# Patient Record
Sex: Male | Born: 1965 | Race: Black or African American | Hispanic: No | Marital: Single | Smoking: Never smoker
Health system: Southern US, Community
[De-identification: ages and names within clinical notes are randomized; demographics above are authoritative.]

## PROBLEM LIST (undated history)

## (undated) DIAGNOSIS — G4733 Obstructive sleep apnea (adult) (pediatric): Secondary | ICD-10-CM

## (undated) DIAGNOSIS — Z87442 Personal history of urinary calculi: Secondary | ICD-10-CM

## (undated) DIAGNOSIS — Z8679 Personal history of other diseases of the circulatory system: Secondary | ICD-10-CM

## (undated) DIAGNOSIS — M75100 Unspecified rotator cuff tear or rupture of unspecified shoulder, not specified as traumatic: Secondary | ICD-10-CM

## (undated) DIAGNOSIS — I10 Essential (primary) hypertension: Secondary | ICD-10-CM

## (undated) DIAGNOSIS — F329 Major depressive disorder, single episode, unspecified: Secondary | ICD-10-CM

## (undated) DIAGNOSIS — D869 Sarcoidosis, unspecified: Secondary | ICD-10-CM

## (undated) DIAGNOSIS — F32A Depression, unspecified: Secondary | ICD-10-CM

## (undated) DIAGNOSIS — Z5189 Encounter for other specified aftercare: Secondary | ICD-10-CM

## (undated) DIAGNOSIS — M4802 Spinal stenosis, cervical region: Secondary | ICD-10-CM

## (undated) DIAGNOSIS — K219 Gastro-esophageal reflux disease without esophagitis: Secondary | ICD-10-CM

## (undated) HISTORY — DX: Major depressive disorder, single episode, unspecified: F32.9

## (undated) HISTORY — DX: Obstructive sleep apnea (adult) (pediatric): G47.33

## (undated) HISTORY — DX: Sarcoidosis, unspecified: D86.9

## (undated) HISTORY — DX: Encounter for other specified aftercare: Z51.89

## (undated) HISTORY — PX: BRONCHOSCOPY: SUR163

## (undated) HISTORY — PX: WISDOM TOOTH EXTRACTION: SHX21

## (undated) HISTORY — PX: ORIF FEMUR FRACTURE: SHX2119

## (undated) HISTORY — DX: Depression, unspecified: F32.A

## (undated) HISTORY — PX: KNEE ARTHROSCOPY: SUR90

---

## 1999-10-07 ENCOUNTER — Ambulatory Visit (HOSPITAL_BASED_OUTPATIENT_CLINIC_OR_DEPARTMENT_OTHER): Admission: RE | Admit: 1999-10-07 | Discharge: 1999-10-07 | Payer: Self-pay | Admitting: Orthopedic Surgery

## 2002-12-12 ENCOUNTER — Emergency Department (HOSPITAL_COMMUNITY): Admission: EM | Admit: 2002-12-12 | Discharge: 2002-12-12 | Payer: Self-pay | Admitting: Emergency Medicine

## 2002-12-12 ENCOUNTER — Encounter: Payer: Self-pay | Admitting: Emergency Medicine

## 2003-01-17 ENCOUNTER — Emergency Department (HOSPITAL_COMMUNITY): Admission: EM | Admit: 2003-01-17 | Discharge: 2003-01-17 | Payer: Self-pay | Admitting: Emergency Medicine

## 2005-08-09 ENCOUNTER — Ambulatory Visit: Payer: Self-pay | Admitting: Pulmonary Disease

## 2005-08-10 ENCOUNTER — Ambulatory Visit: Admission: RE | Admit: 2005-08-10 | Discharge: 2005-08-10 | Payer: Self-pay | Admitting: Pulmonary Disease

## 2005-08-15 ENCOUNTER — Ambulatory Visit: Payer: Self-pay | Admitting: Pulmonary Disease

## 2005-08-15 ENCOUNTER — Encounter (INDEPENDENT_AMBULATORY_CARE_PROVIDER_SITE_OTHER): Payer: Self-pay | Admitting: *Deleted

## 2005-08-15 ENCOUNTER — Ambulatory Visit (HOSPITAL_COMMUNITY): Admission: RE | Admit: 2005-08-15 | Discharge: 2005-08-15 | Payer: Self-pay | Admitting: Pulmonary Disease

## 2005-08-25 ENCOUNTER — Ambulatory Visit: Payer: Self-pay | Admitting: Pulmonary Disease

## 2005-10-09 ENCOUNTER — Ambulatory Visit: Payer: Self-pay | Admitting: Pulmonary Disease

## 2005-12-20 ENCOUNTER — Ambulatory Visit: Payer: Self-pay | Admitting: Pulmonary Disease

## 2006-06-09 ENCOUNTER — Emergency Department (HOSPITAL_COMMUNITY): Admission: EM | Admit: 2006-06-09 | Discharge: 2006-06-09 | Payer: Self-pay | Admitting: Emergency Medicine

## 2009-09-15 ENCOUNTER — Encounter: Payer: Self-pay | Admitting: Pulmonary Disease

## 2009-09-15 ENCOUNTER — Ambulatory Visit: Payer: Self-pay | Admitting: Pulmonary Disease

## 2009-09-15 DIAGNOSIS — D869 Sarcoidosis, unspecified: Secondary | ICD-10-CM

## 2009-09-22 ENCOUNTER — Ambulatory Visit: Payer: Self-pay | Admitting: Internal Medicine

## 2009-09-22 LAB — CONVERTED CEMR LAB
Angiotensin 1 Converting Enzyme: 60 units/L (ref 9–67)
Anti Nuclear Antibody(ANA): NEGATIVE

## 2009-09-29 ENCOUNTER — Telehealth: Payer: Self-pay | Admitting: Pulmonary Disease

## 2009-10-08 ENCOUNTER — Ambulatory Visit: Payer: Self-pay | Admitting: Pulmonary Disease

## 2009-10-08 ENCOUNTER — Telehealth (INDEPENDENT_AMBULATORY_CARE_PROVIDER_SITE_OTHER): Payer: Self-pay | Admitting: *Deleted

## 2009-10-08 DIAGNOSIS — R002 Palpitations: Secondary | ICD-10-CM

## 2010-09-27 IMAGING — CR DG CHEST 2V
1 series · 1 of 1 positions shown · non-contrast
Comparison: 12/20/2005 (outside x-ray)

CLINICAL DATA: Dyspnea/sarcoidosis

CHEST - 2 VIEW

[view not recorded]
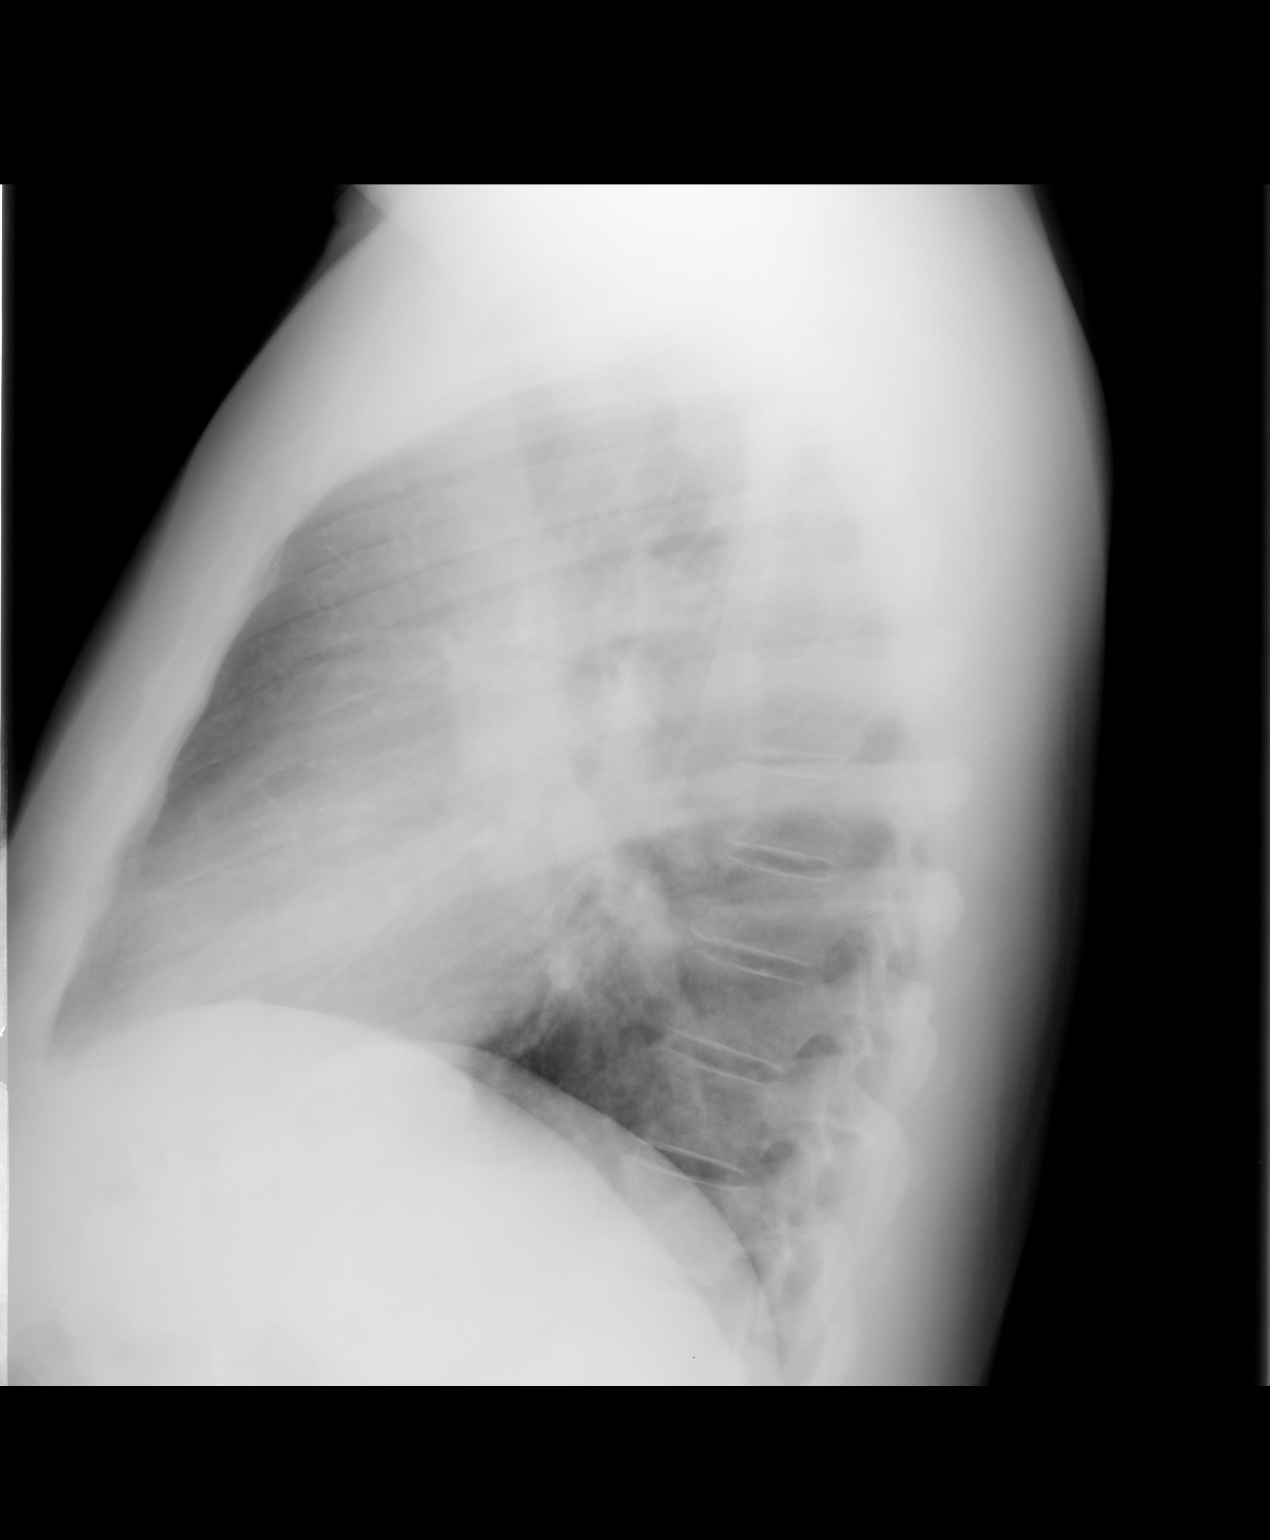

[1 of 1 positions shown; findings below may reference images not displayed]

FINDINGS: Hilar adenopathy has essentially resolved.  There is
perhaps slight fullness of the left hilum.

Heart and vascularity normal.  Lungs clear.  No pleural fluid.
IMPRESSION: 1.  Significant interval decrease in hilar adenopathy. There is
still mild fullness of the left hilum.
2.  Currently no active disease.

## 2011-01-15 LAB — CONVERTED CEMR LAB
AST: 58 units/L — ABNORMAL HIGH (ref 0–37)
Alkaline Phosphatase: 42 units/L (ref 39–117)
Basophils Absolute: 0 10*3/uL (ref 0.0–0.1)
Bilirubin, Direct: 0.1 mg/dL (ref 0.0–0.3)
CO2: 31 meq/L (ref 19–32)
Calcium: 9.6 mg/dL (ref 8.4–10.5)
Creatinine, Ser: 1 mg/dL (ref 0.4–1.5)
Eosinophils Absolute: 0.2 10*3/uL (ref 0.0–0.7)
GFR calc non Af Amer: 104.8 mL/min (ref >60)
Glucose, Bld: 95 mg/dL (ref 70–99)
Lymphocytes Relative: 34 % (ref 12.0–46.0)
MCHC: 34 g/dL (ref 30.0–36.0)
Monocytes Relative: 12.9 % — ABNORMAL HIGH (ref 3.0–12.0)
Neutrophils Relative %: 49.1 % (ref 43.0–77.0)
RBC: 5.21 M/uL (ref 4.22–5.81)
RDW: 12.7 % (ref 11.5–14.6)
Sed Rate: 10 mm/hr (ref 0–22)
Sodium: 142 meq/L (ref 135–145)

## 2011-05-05 NOTE — Op Note (Signed)
NAME:  Logan Holt, Logan Holt                   ACCOUNT NO.:  [id number]   MEDICAL RECORD NO.:  [id number]          PATIENT TYPE:  AMB   LOCATION:  ENDO                         FACILITY:  [facility]   PHYSICIAN:  Coralyn Helling, M.D.      DATE OF BIRTH:  May 23, 1966   DATE OF PROCEDURE:  08/15/2005  DATE OF DISCHARGE:                                 OPERATIVE REPORT   PREOPERATIVE DIAGNOSIS:  Hilar adenopathy, rule out sarcoidosis and rule out  lymphoma.   POSTOPERATIVE DIAGNOSIS:  Hilar adenopathy, rule out sarcoidosis and rule  out lymphoma.   PROCEDURE:  Bronchoscopy with transbronchial needle aspiration and  transbronchial biopsy and bronchial brushing and bronchial washing and BAL.   SURGEON:  Coralyn Helling, M.D.   The patient was given 50 mg of Demerol and 4 mg of Versed IV for sedation.  His airway was anesthetized with topical lidocaine.  The bronchoscopy was  introduced and vocal cords appeared normal with normal motion.  The  inspection of the right upper, right middle and right lower lobes showed  normal mucosa without endobronchial lesions.  The carina appeared slightly  widened.  On inspection of the left upper, left middle, left lingular and  left lower lobe the mucosa appeared normal and there was no obvious  endobronchial lesions.  Transbronchial needle aspiration was done of the  subcarinal lymph nodes.  Then bronchial brushing and transbronchial biopsy  was done from the left lower lobe as well as BAL from the left lower lobe.  Bleeding was well controlled and there were no immediate complications.  The  patient was transferred back to recovery in stable condition.  The specimens  will be sent for cytology, culture as well as pathology.  A post procedure x-  ray will be obtained.           ______________________________  Coralyn Helling, M.D.     VS/MEDQ  D:  08/15/2005  T:  08/15/2005  Job:  [id number]

## 2012-01-16 ENCOUNTER — Ambulatory Visit: Payer: Self-pay | Admitting: Professional

## 2012-03-14 ENCOUNTER — Other Ambulatory Visit: Payer: Self-pay | Admitting: Family Medicine

## 2012-03-14 DIAGNOSIS — R109 Unspecified abdominal pain: Secondary | ICD-10-CM

## 2012-03-15 ENCOUNTER — Other Ambulatory Visit: Payer: Self-pay

## 2012-03-20 ENCOUNTER — Ambulatory Visit
Admission: RE | Admit: 2012-03-20 | Discharge: 2012-03-20 | Disposition: A | Discharge status: Home / Self Care | Payer: BC Managed Care – PPO | Source: Ambulatory Visit | Attending: Family Medicine | Admitting: Family Medicine

## 2012-03-20 DIAGNOSIS — R109 Unspecified abdominal pain: Secondary | ICD-10-CM

## 2012-03-20 MED ORDER — IOHEXOL 300 MG/ML  SOLN
100.0000 mL | Freq: Once | INTRAMUSCULAR | Status: AC | PRN
  Administered 2012-03-20: 100 mL via INTRAVENOUS

## 2012-06-21 ENCOUNTER — Emergency Department (HOSPITAL_COMMUNITY): Payer: BC Managed Care – PPO

## 2012-06-21 ENCOUNTER — Emergency Department (HOSPITAL_COMMUNITY)
Admission: EM | Admit: 2012-06-21 | Discharge: 2012-06-21 | Disposition: A | Discharge status: Home / Self Care | Payer: BC Managed Care – PPO | Attending: Emergency Medicine | Admitting: Emergency Medicine

## 2012-06-21 ENCOUNTER — Encounter (HOSPITAL_COMMUNITY): Payer: Self-pay | Admitting: Emergency Medicine

## 2012-06-21 DIAGNOSIS — R109 Unspecified abdominal pain: Secondary | ICD-10-CM | POA: Insufficient documentation

## 2012-06-21 DIAGNOSIS — Z87442 Personal history of urinary calculi: Secondary | ICD-10-CM | POA: Insufficient documentation

## 2012-06-21 DIAGNOSIS — I10 Essential (primary) hypertension: Secondary | ICD-10-CM | POA: Insufficient documentation

## 2012-06-21 HISTORY — DX: Essential (primary) hypertension: I10

## 2012-06-21 LAB — URINALYSIS, ROUTINE W REFLEX MICROSCOPIC
Bilirubin Urine: NEGATIVE
Specific Gravity, Urine: 1.016 (ref 1.005–1.030)
Urobilinogen, UA: 0.2 mg/dL (ref 0.0–1.0)
pH: 7.5 (ref 5.0–8.0)

## 2012-06-21 LAB — POCT I-STAT, CHEM 8
BUN: 13 mg/dL (ref 6–23)
Chloride: 101 mEq/L (ref 96–112)
Creatinine, Ser: 1.2 mg/dL (ref 0.50–1.35)
Glucose, Bld: 130 mg/dL — ABNORMAL HIGH (ref 70–99)
Potassium: 3 mEq/L — ABNORMAL LOW (ref 3.5–5.1)

## 2012-06-21 LAB — URINE MICROSCOPIC-ADD ON

## 2012-06-21 MED ORDER — OXYCODONE-ACETAMINOPHEN 5-325 MG PO TABS
2.0000 | ORAL_TABLET | Freq: Once | ORAL | Status: AC
  Administered 2012-06-21: 2 via ORAL
  Filled 2012-06-21: qty 2

## 2012-06-21 MED ORDER — POTASSIUM CHLORIDE CRYS ER 20 MEQ PO TBCR
40.0000 meq | EXTENDED_RELEASE_TABLET | Freq: Once | ORAL | Status: AC
  Administered 2012-06-21: 40 meq via ORAL
  Filled 2012-06-21: qty 2

## 2012-06-21 MED ORDER — TAMSULOSIN HCL 0.4 MG PO CAPS: 0.4000 mg | ORAL_CAPSULE | Freq: Every day | ORAL | Status: DC

## 2012-06-21 MED ORDER — OXYCODONE-ACETAMINOPHEN 5-325 MG PO TABS: 1.0000 | ORAL_TABLET | ORAL | Status: AC | PRN

## 2012-06-21 MED ORDER — ONDANSETRON 4 MG PO TBDP
8.0000 mg | ORAL_TABLET | Freq: Once | ORAL | Status: AC
  Administered 2012-06-21: 8 mg via ORAL
  Filled 2012-06-21: qty 2

## 2012-06-21 NOTE — ED Provider Notes (Signed)
Medical screening examination/treatment/procedure(s) were performed by non-physician practitioner and as supervising physician I was immediately available for consultation/collaboration.  Toy Baker, MD 06/21/12 1031

## 2012-06-21 NOTE — ED Notes (Signed)
Left flank pain started around 0245, was woken up with sharp pain on my left side.

## 2012-06-21 NOTE — ED Notes (Signed)
Patient transported to X-ray 

## 2012-06-21 NOTE — ED Provider Notes (Signed)
Pt's UA shows hgb only - no evidence of infx or crystals. Pt will be txed as presumed nephrolithiasis. He has been resting comfortably during his stay in the ED. Rxes for flomax, percocet - instructed to f/u with urology.  Grant Fontana, [place] 06/21/12 [phone]

## 2012-06-21 NOTE — ED Notes (Addendum)
Pt has left sided pain; hx of kidney stones and reports this feels similar; tender to palpation. Nauseated; pain in testicle area on left side as well.

## 2012-06-21 NOTE — ED Notes (Signed)
Pt returned from xray

## 2012-06-21 NOTE — ED Provider Notes (Signed)
History     CSN: [id number]  Arrival date & time 06/21/12  0348   First MD Initiated Contact with Patient 06/21/12 [phone]      Chief Complaint  Patient presents with  . Flank Pain    HPI  History provided by the patient. Patient is a 46 year old male with history of hypertension and prior kidney stones presents with complaints of left flank and abdominal pains. Patient states pain began acutely and woke up from sleep around 2 or 3 AM. Patient states he waited at home to see if symptoms would relieve on their own. Patient tried to make it comfortable in many positions without any change in symptoms. Pain has been gradually increasing over this time. Patient denies any other aggravating or alleviating factors. Pain is described as sharp radiating to left lower abdomen and groin occasionally testicle. Pain is somewhat similar to a kidney stone 2 years ago. Symptoms are also associated with slight nausea. Patient denies any associated fever, chills, sweats, vomiting, diarrhea or constipation.     Past Medical History  Diagnosis Date  . Hypertension   . Kidney calculi     History reviewed. No pertinent past surgical history.  No family history on file.  History  Substance Use Topics  . Smoking status: Not on file  . Smokeless tobacco: Not on file  . Alcohol Use:       Review of Systems  Constitutional: Negative for fever, chills and appetite change.  Gastrointestinal: Positive for nausea and abdominal pain. Negative for vomiting, diarrhea, constipation and blood in stool.  Genitourinary: Positive for flank pain. Negative for dysuria, frequency, hematuria, discharge, scrotal swelling, penile pain and testicular pain.  Skin: Negative for rash.    Allergies  Review of patient's allergies indicates no known allergies.  Home Medications  No current outpatient prescriptions on file.  BP 158/106  Pulse 86  Temp 97.8 F (36.6 C)  Resp 20  SpO2 100%  Physical Exam  Nursing  note and vitals reviewed. Constitutional: He is oriented to person, place, and time. He appears well-developed and well-nourished. No distress.  HENT:  Head: Normocephalic.  Cardiovascular: Normal rate and regular rhythm.   Pulmonary/Chest: Effort normal and breath sounds normal. No respiratory distress. He has no wheezes. He has no rales.  Abdominal: Soft. There is tenderness in the left upper quadrant and left lower quadrant. There is CVA tenderness. There is no rebound, no guarding, no tenderness at McBurney's point and negative Murphy's sign.       Mild left CVA tenderness  Musculoskeletal: Normal range of motion.  Neurological: He is alert and oriented to person, place, and time.  Skin: Skin is warm.  Psychiatric: He has a normal mood and affect. His behavior is normal.    ED Course  Procedures   Results for orders placed during the hospital encounter of 06/21/12  POCT I-STAT, CHEM 8      Component Value Range   Sodium 141  135 - 145 mEq/L   Potassium 3.0 (*) 3.5 - 5.1 mEq/L   Chloride 101  96 - 112 mEq/L   BUN 13  6 - 23 mg/dL   Creatinine, Ser 4.13  0.50 - 1.35 mg/dL   Glucose, Bld 244 (*) 70 - 99 mg/dL   Calcium, Ion 0.10  2.72 - 1.23 mmol/L   TCO2 26  0 - 100 mmol/L   Hemoglobin 15.0  13.0 - 17.0 g/dL   HCT 53.6  64.4 - 03.4 %  Dg Abd 1 View  06/21/2012  *RADIOLOGY REPORT*  Clinical Data: Left flank pain.  ABDOMEN - 1 VIEW  Comparison: 03/20/2012 CT  Findings: There are numerous calcifications projecting over the left pelvis.  These are primarily in keeping with phlebolith however it is not possible to exclude ureteral stone in this setting.  The tiny renal stones noted on recent CT are poorly demonstrated radiographically.  Moderate stool burden. Nonobstructive bowel gas pattern.  Heterotopic bone along the left femur.  No acute osseous finding.  IMPRESSION: Multiple pelvic phlebolith on the left.  It is not possible to exclude a small ureteral stone in this setting.   If concern persists, consider ultrasound to evaluate for hydronephrosis.  Original Report Authenticated By: Waneta Martins, M.D.     1. Left flank pain       MDM  4:05 AM patient seen and evaluated. Patient in no acute distress and appears comfortable.  Patient with a CT scan 3 months ago showing 2 mm bilateral kidney stones. No ureteral stones that time..There is significant interval change in size of the kidney stones. Will obtain KUB to evaluate for ureteral stone at this time.   Pt feeling better after Percocet.  Pt is sleeping.  Pt has not given urine yet.  Labs with slight hypokalemia otherwise unremarkable.  PO potassium given.  KUB with no definite ureteral stone.  Pt discussed in sign out with Grant Fontana PA-C.  She will follow UA and dispo pt.     Angus Seller, [place] 06/21/12 [phone]

## 2012-06-22 NOTE — ED Provider Notes (Signed)
Medical screening examination/treatment/procedure(s) were performed by non-physician practitioner and as supervising physician I was immediately available for consultation/collaboration.  Twylah Bennetts, MD 06/22/12 0435 

## 2013-07-03 IMAGING — CR DG ABDOMEN 1V
1 series · 1 of 1 positions shown · non-contrast
Comparison: 03/20/2012 CT

CLINICAL DATA: Left flank pain.

ABDOMEN - 1 VIEW

[t abdomen supine]
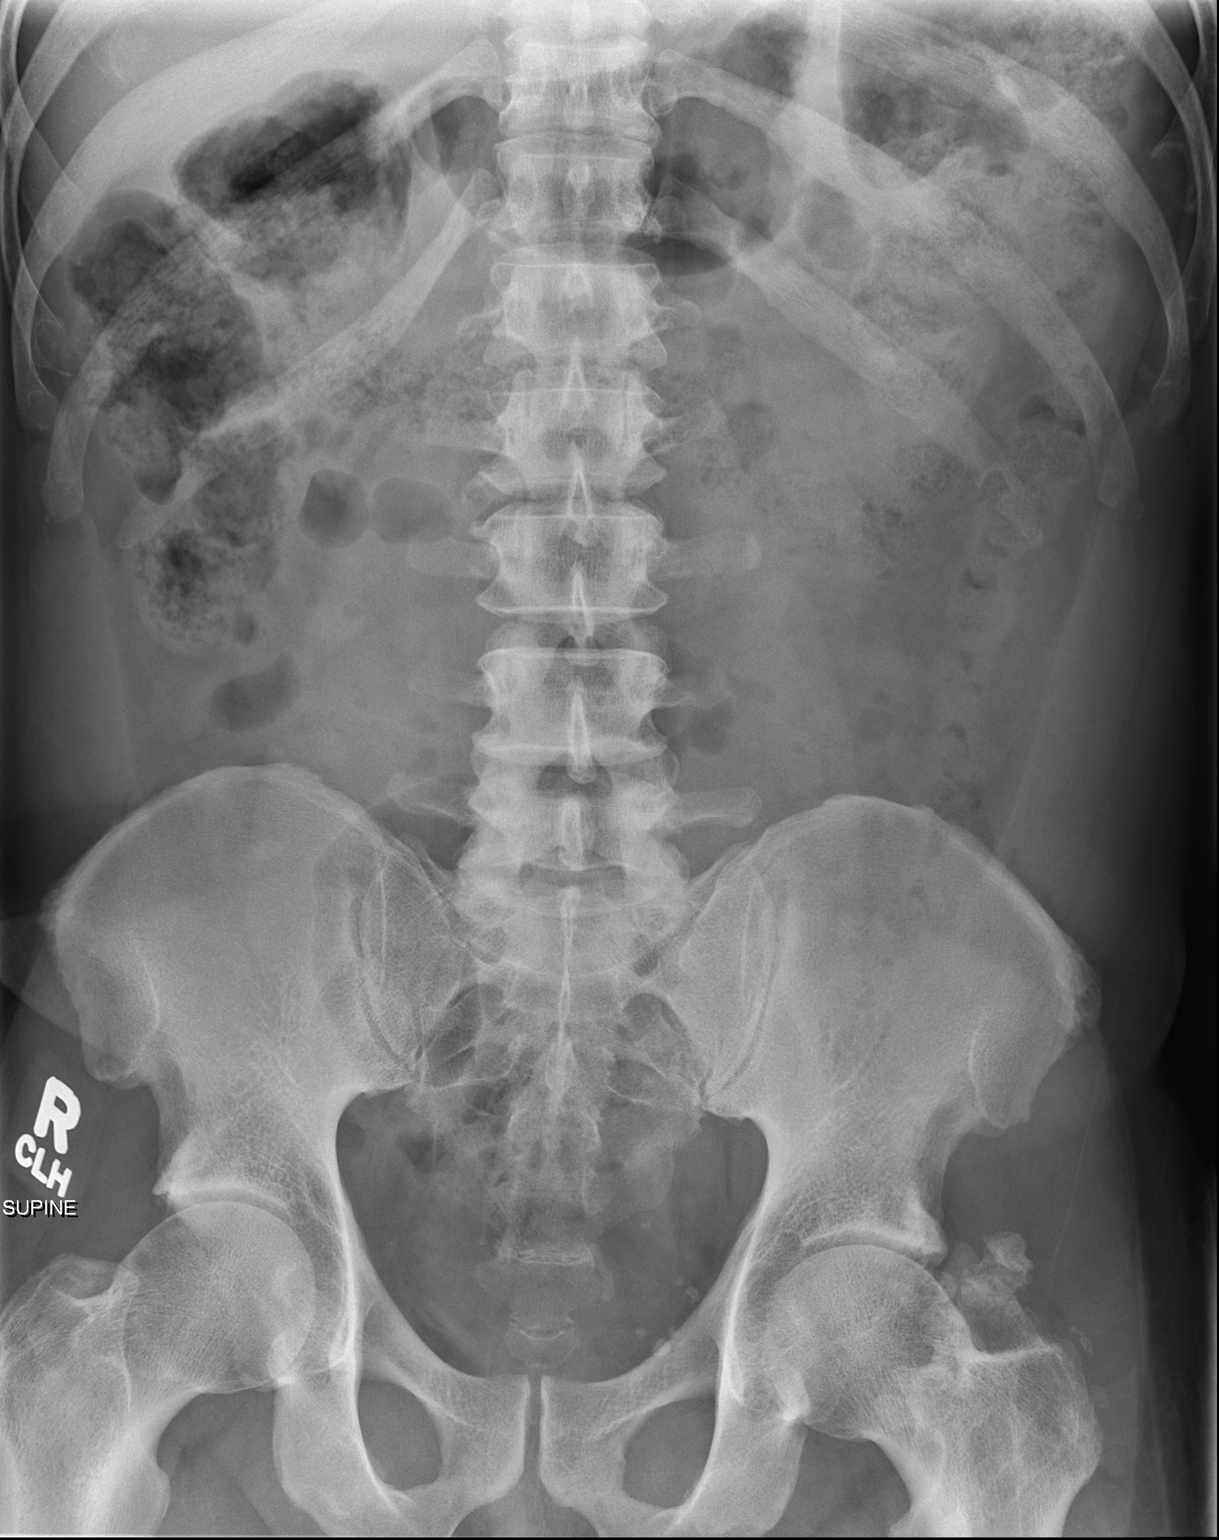

[1 of 1 positions shown; findings below may reference images not displayed]

FINDINGS: There are numerous calcifications projecting over the
left pelvis.  These are primarily in keeping with phlebolith
however it is not possible to exclude ureteral stone in this
setting.  The tiny renal stones noted on recent CT are poorly
demonstrated radiographically.  Moderate stool burden.
Nonobstructive bowel gas pattern.  Heterotopic bone along the left
femur.  No acute osseous finding.
IMPRESSION: Multiple pelvic phlebolith on the left.  It is not possible to
exclude a small ureteral stone in this setting.

If concern persists, consider ultrasound to evaluate for
hydronephrosis.

## 2014-01-22 ENCOUNTER — Encounter: Payer: Self-pay | Admitting: General Surgery

## 2014-01-22 DIAGNOSIS — I493 Ventricular premature depolarization: Secondary | ICD-10-CM

## 2014-01-22 DIAGNOSIS — G4733 Obstructive sleep apnea (adult) (pediatric): Secondary | ICD-10-CM | POA: Insufficient documentation

## 2014-02-17 ENCOUNTER — Ambulatory Visit: Payer: BC Managed Care – PPO | Admitting: Family Medicine

## 2014-03-18 DIAGNOSIS — M75100 Unspecified rotator cuff tear or rupture of unspecified shoulder, not specified as traumatic: Secondary | ICD-10-CM

## 2014-03-18 HISTORY — DX: Unspecified rotator cuff tear or rupture of unspecified shoulder, not specified as traumatic: M75.100

## 2014-04-14 ENCOUNTER — Other Ambulatory Visit: Payer: Self-pay | Admitting: Orthopedic Surgery

## 2014-04-15 ENCOUNTER — Encounter (HOSPITAL_BASED_OUTPATIENT_CLINIC_OR_DEPARTMENT_OTHER): Payer: Self-pay | Admitting: *Deleted

## 2014-04-20 NOTE — H&P (Signed)
Logan Holt is an 48 y.o. male.   Chief Complaint: c/o chronic and progressive right shoulder pain and weakness HPI: .  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  He reports a one year history of progressive right shoulder pain.  When he moves his shoulder he hears crunching.  He cannot put a sleeve on without pain when he reaches behind his back.  He cannot lie on the right side.  He can no longer throw a ball without significant pain.  He has lost power.  He cannot shoot basketball.  He has tried Myoflex without relief.  He is now referred for an upper extremity orthopaedic opinion.     Past Medical History  Diagnosis Date  . Sarcoidosis   . History of hypertension     was taken off med. after losing weight  . OSA (obstructive sleep apnea)     was diagnosed when weight was 205; does not use CPAP  . GERD (gastroesophageal reflux disease)     prn Nexium  . Rotator cuff tear 03/2014    right  . History of kidney stones     Past Surgical History  Procedure Laterality Date  . Bronchoscopy    . Knee arthroscopy Left   . Orif femur fracture Left     History reviewed. No pertinent family history. Social History:  reports that he has never smoked. He has never used smokeless tobacco. He reports that he does not drink alcohol or use illicit drugs.  Allergies: No Known Allergies  No prescriptions prior to admission    No results found for this or any previous visit (from the past 48 hour(s)).  No results found.   Pertinent items are noted in HPI.  Height 5\' 5"  (1.651 m), weight 81.647 kg (180 lb).  General appearance: alert Head: Normocephalic, without obvious abnormality Neck: supple, symmetrical, trachea midline Resp: clear to auscultation bilaterally Cardio: regular rate and rhythm GI: normal findings: bowel sounds normal Extremities: .  He has range of motion combined elevation right shoulder 160, left shoulder 175, external rotation right  shoulder 80, left shoulder 90 at 90 degrees abduction, internal rotation right shoulder iliac crest, left shoulder T-12.  He has a very painful push-off test on the right, tight, but not painful on the left.  He has pain with cross-torso adduction, belly pat or opposite shoulder pat.  His push-off test is extremely painful.   His examination is compatible with a subscapularis rotator cuff tear and possible supraspinatus rotator cuff tear.  He clearly has internal derangement of his shoulder mechanism. Plain films of the shoulder are reviewed.  He has an osteophyte at the lateral acromion, AC degenerative change and sclerosis of the greater tuberosity with new bone formation consistent with rotator cuff tear.  On his internal rotation film there is sclerosis of the greater tuberosity consistent with cuff disease.    He had MRI obtained at [facility] on 04/01/14. He returns to interpret the MRI.  He has a very significant tear of his anterior supraspinatus and extensive tendinopathy of his supraspinatus and infraspinatus tendons.  He has swelling and tendinopathy of the subscapularis without evidence of a tear.  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 . Plan: To the OR  for right SA with SAD/DCR and RC repair as needed.The procedure, risks,benefits and post-op course were discussed with the patient at length and they were in agreement with the plan.  Marveen ReeksRobert J Dasnoit 04/20/2014, 4:41 PM  H&P documentation: 04/21/2014  -History and Physical Reviewed  -Patient has been re-examined  -No change in the plan of care  Wyn Forsterobert V Gracia Saggese Jr, MD

## 2014-04-21 ENCOUNTER — Encounter (HOSPITAL_BASED_OUTPATIENT_CLINIC_OR_DEPARTMENT_OTHER)
Admission: RE | Disposition: A | Discharge status: Home / Self Care | Payer: Self-pay | Source: Ambulatory Visit | Attending: Orthopedic Surgery

## 2014-04-21 ENCOUNTER — Encounter (HOSPITAL_BASED_OUTPATIENT_CLINIC_OR_DEPARTMENT_OTHER): Payer: Self-pay | Admitting: Orthopedic Surgery

## 2014-04-21 ENCOUNTER — Ambulatory Visit (HOSPITAL_BASED_OUTPATIENT_CLINIC_OR_DEPARTMENT_OTHER)
Admission: RE | Admit: 2014-04-21 | Discharge: 2014-04-22 | Disposition: A | Discharge status: Home / Self Care | Payer: 59 | Source: Ambulatory Visit | Attending: Orthopedic Surgery | Admitting: Orthopedic Surgery

## 2014-04-21 ENCOUNTER — Ambulatory Visit (HOSPITAL_BASED_OUTPATIENT_CLINIC_OR_DEPARTMENT_OTHER): Payer: 59 | Admitting: Anesthesiology

## 2014-04-21 ENCOUNTER — Encounter (HOSPITAL_BASED_OUTPATIENT_CLINIC_OR_DEPARTMENT_OTHER): Payer: 59 | Admitting: Anesthesiology

## 2014-04-21 DIAGNOSIS — G4733 Obstructive sleep apnea (adult) (pediatric): Secondary | ICD-10-CM | POA: Insufficient documentation

## 2014-04-21 DIAGNOSIS — M7580 Other shoulder lesions, unspecified shoulder: Secondary | ICD-10-CM

## 2014-04-21 DIAGNOSIS — K219 Gastro-esophageal reflux disease without esophagitis: Secondary | ICD-10-CM | POA: Insufficient documentation

## 2014-04-21 DIAGNOSIS — I10 Essential (primary) hypertension: Secondary | ICD-10-CM | POA: Insufficient documentation

## 2014-04-21 DIAGNOSIS — D869 Sarcoidosis, unspecified: Secondary | ICD-10-CM | POA: Insufficient documentation

## 2014-04-21 DIAGNOSIS — X58XXXA Exposure to other specified factors, initial encounter: Secondary | ICD-10-CM | POA: Insufficient documentation

## 2014-04-21 DIAGNOSIS — M75100 Unspecified rotator cuff tear or rupture of unspecified shoulder, not specified as traumatic: Secondary | ICD-10-CM | POA: Diagnosis present

## 2014-04-21 DIAGNOSIS — M25819 Other specified joint disorders, unspecified shoulder: Secondary | ICD-10-CM | POA: Insufficient documentation

## 2014-04-21 DIAGNOSIS — S43429A Sprain of unspecified rotator cuff capsule, initial encounter: Secondary | ICD-10-CM | POA: Insufficient documentation

## 2014-04-21 DIAGNOSIS — Y929 Unspecified place or not applicable: Secondary | ICD-10-CM | POA: Insufficient documentation

## 2014-04-21 DIAGNOSIS — S43439A Superior glenoid labrum lesion of unspecified shoulder, initial encounter: Secondary | ICD-10-CM | POA: Insufficient documentation

## 2014-04-21 HISTORY — DX: Gastro-esophageal reflux disease without esophagitis: K21.9

## 2014-04-21 HISTORY — PX: SHOULDER ARTHROSCOPY WITH ROTATOR CUFF REPAIR AND SUBACROMIAL DECOMPRESSION: SHX5686

## 2014-04-21 HISTORY — DX: Personal history of other diseases of the circulatory system: Z86.79

## 2014-04-21 HISTORY — DX: Unspecified rotator cuff tear or rupture of unspecified shoulder, not specified as traumatic: M75.100

## 2014-04-21 HISTORY — DX: Personal history of urinary calculi: Z87.442

## 2014-04-21 LAB — POCT HEMOGLOBIN-HEMACUE: HEMOGLOBIN: 15.3 g/dL (ref 13.0–17.0)

## 2014-04-21 SURGERY — SHOULDER ARTHROSCOPY WITH ROTATOR CUFF REPAIR AND SUBACROMIAL DECOMPRESSION
Anesthesia: Regional | Site: Shoulder | Laterality: Right

## 2014-04-21 MED ORDER — MIDAZOLAM HCL 2 MG/2ML IJ SOLN
INTRAMUSCULAR | Status: AC
  Filled 2014-04-21: qty 2

## 2014-04-21 MED ORDER — HYDROMORPHONE HCL PF 1 MG/ML IJ SOLN: 0.2500 mg | INTRAMUSCULAR | Status: DC | PRN

## 2014-04-21 MED ORDER — OXYCODONE HCL 5 MG PO TABS: 5.0000 mg | ORAL_TABLET | Freq: Once | ORAL | Status: DC | PRN

## 2014-04-21 MED ORDER — LIDOCAINE HCL (CARDIAC) 20 MG/ML IV SOLN
INTRAVENOUS | Status: DC | PRN
  Administered 2014-04-21: 50 mg via INTRAVENOUS

## 2014-04-21 MED ORDER — FENTANYL CITRATE 0.05 MG/ML IJ SOLN
INTRAMUSCULAR | Status: DC | PRN
  Administered 2014-04-21 (×2): 100 ug via INTRAVENOUS

## 2014-04-21 MED ORDER — CEPHALEXIN 500 MG PO CAPS: 500.0000 mg | ORAL_CAPSULE | Freq: Three times a day (TID) | ORAL | Status: DC

## 2014-04-21 MED ORDER — ONDANSETRON HCL 4 MG PO TABS: 4.0000 mg | ORAL_TABLET | Freq: Four times a day (QID) | ORAL | Status: DC | PRN

## 2014-04-21 MED ORDER — PHENYLEPHRINE HCL 10 MG/ML IJ SOLN
INTRAMUSCULAR | Status: DC | PRN
  Administered 2014-04-21 (×6): 50 ug via INTRAVENOUS

## 2014-04-21 MED ORDER — DOCUSATE SODIUM 100 MG PO CAPS: 100.0000 mg | ORAL_CAPSULE | Freq: Two times a day (BID) | ORAL | Status: DC

## 2014-04-21 MED ORDER — GLYCOPYRROLATE 0.2 MG/ML IJ SOLN
INTRAMUSCULAR | Status: DC | PRN
  Administered 2014-04-21: .5 mg via INTRAVENOUS

## 2014-04-21 MED ORDER — SUCCINYLCHOLINE CHLORIDE 20 MG/ML IJ SOLN
INTRAMUSCULAR | Status: DC | PRN
  Administered 2014-04-21: 60 mg via INTRAVENOUS

## 2014-04-21 MED ORDER — OXYCODONE-ACETAMINOPHEN 5-325 MG PO TABS: 1.0000 | ORAL_TABLET | ORAL | Status: DC | PRN

## 2014-04-21 MED ORDER — OXYCODONE HCL 5 MG/5ML PO SOLN: 5.0000 mg | Freq: Once | ORAL | Status: DC | PRN

## 2014-04-21 MED ORDER — DEXAMETHASONE SODIUM PHOSPHATE 4 MG/ML IJ SOLN
INTRAMUSCULAR | Status: DC | PRN
  Administered 2014-04-21: 10 mg via INTRAVENOUS

## 2014-04-21 MED ORDER — SODIUM CHLORIDE 0.9 % IR SOLN
Status: DC | PRN
  Administered 2014-04-21: 12000 mL

## 2014-04-21 MED ORDER — CEFAZOLIN SODIUM-DEXTROSE 2-3 GM-% IV SOLR
INTRAVENOUS | Status: AC
  Filled 2014-04-21: qty 50

## 2014-04-21 MED ORDER — METOCLOPRAMIDE HCL 5 MG PO TABS: 5.0000 mg | ORAL_TABLET | Freq: Three times a day (TID) | ORAL | Status: DC | PRN

## 2014-04-21 MED ORDER — CEFAZOLIN SODIUM-DEXTROSE 2-3 GM-% IV SOLR
2.0000 g | INTRAVENOUS | Status: AC
  Administered 2014-04-21: 2 g via INTRAVENOUS

## 2014-04-21 MED ORDER — BUPIVACAINE HCL (PF) 0.5 % IJ SOLN
INTRAMUSCULAR | Status: DC | PRN
  Administered 2014-04-21: 40 mL
  Administered 2014-04-21: 20 mL via PERINEURAL

## 2014-04-21 MED ORDER — FENTANYL CITRATE 0.05 MG/ML IJ SOLN
INTRAMUSCULAR | Status: AC
  Filled 2014-04-21: qty 4

## 2014-04-21 MED ORDER — ACETAMINOPHEN 160 MG/5ML PO SOLN: 325.0000 mg | ORAL | Status: DC | PRN

## 2014-04-21 MED ORDER — METHOCARBAMOL 1000 MG/10ML IJ SOLN: 500.0000 mg | Freq: Four times a day (QID) | INTRAVENOUS | Status: DC | PRN

## 2014-04-21 MED ORDER — HYDROMORPHONE HCL 2 MG PO TABS: ORAL_TABLET | ORAL | Status: DC

## 2014-04-21 MED ORDER — LACTATED RINGERS IV SOLN
INTRAVENOUS | Status: DC
  Administered 2014-04-21 (×2): via INTRAVENOUS

## 2014-04-21 MED ORDER — CEFAZOLIN SODIUM-DEXTROSE 2-3 GM-% IV SOLR
2.0000 g | Freq: Four times a day (QID) | INTRAVENOUS | Status: AC
  Administered 2014-04-21 – 2014-04-22 (×2): 2 g via INTRAVENOUS

## 2014-04-21 MED ORDER — CEFAZOLIN SODIUM-DEXTROSE 2-3 GM-% IV SOLR: 2.0000 g | Freq: Four times a day (QID) | INTRAVENOUS | Status: DC

## 2014-04-21 MED ORDER — FENTANYL CITRATE 0.05 MG/ML IJ SOLN
INTRAMUSCULAR | Status: AC
  Filled 2014-04-21: qty 2

## 2014-04-21 MED ORDER — DEXTROSE-NACL 5-0.45 % IV SOLN
INTRAVENOUS | Status: DC
  Administered 2014-04-21 – 2014-04-22 (×2): via INTRAVENOUS

## 2014-04-21 MED ORDER — ROCURONIUM BROMIDE 100 MG/10ML IV SOLN
INTRAVENOUS | Status: DC | PRN
  Administered 2014-04-21: 20 mg via INTRAVENOUS

## 2014-04-21 MED ORDER — HYDROCODONE-ACETAMINOPHEN 5-325 MG PO TABS
1.0000 | ORAL_TABLET | ORAL | Status: DC | PRN
  Administered 2014-04-22: 2 via ORAL
  Filled 2014-04-21: qty 2

## 2014-04-21 MED ORDER — METHOCARBAMOL 500 MG PO TABS
500.0000 mg | ORAL_TABLET | Freq: Four times a day (QID) | ORAL | Status: DC | PRN
  Administered 2014-04-22: 500 mg via ORAL
  Filled 2014-04-21: qty 1

## 2014-04-21 MED ORDER — MIDAZOLAM HCL 5 MG/5ML IJ SOLN
INTRAMUSCULAR | Status: DC | PRN
  Administered 2014-04-21: 2 mg via INTRAVENOUS

## 2014-04-21 MED ORDER — ONDANSETRON HCL 4 MG/2ML IJ SOLN
4.0000 mg | Freq: Four times a day (QID) | INTRAMUSCULAR | Status: DC | PRN
  Administered 2014-04-21: 4 mg via INTRAVENOUS
  Filled 2014-04-21: qty 2

## 2014-04-21 MED ORDER — CHLORHEXIDINE GLUCONATE 4 % EX LIQD: 60.0000 mL | Freq: Once | CUTANEOUS | Status: DC

## 2014-04-21 MED ORDER — FENTANYL CITRATE 0.05 MG/ML IJ SOLN
50.0000 ug | INTRAMUSCULAR | Status: DC | PRN
  Administered 2014-04-21: 100 ug via INTRAVENOUS

## 2014-04-21 MED ORDER — NEOSTIGMINE METHYLSULFATE 10 MG/10ML IV SOLN
INTRAVENOUS | Status: DC | PRN
  Administered 2014-04-21: 4 mg via INTRAVENOUS

## 2014-04-21 MED ORDER — MIDAZOLAM HCL 2 MG/ML PO SYRP: 12.0000 mg | ORAL_SOLUTION | Freq: Once | ORAL | Status: DC | PRN

## 2014-04-21 MED ORDER — METOPROLOL TARTRATE 1 MG/ML IV SOLN
INTRAVENOUS | Status: DC | PRN
  Administered 2014-04-21: 3 mg via INTRAVENOUS

## 2014-04-21 MED ORDER — METOCLOPRAMIDE HCL 5 MG/ML IJ SOLN: 5.0000 mg | Freq: Three times a day (TID) | INTRAMUSCULAR | Status: DC | PRN

## 2014-04-21 MED ORDER — MIDAZOLAM HCL 2 MG/2ML IJ SOLN
1.0000 mg | INTRAMUSCULAR | Status: DC | PRN
  Administered 2014-04-21: 2 mg via INTRAVENOUS

## 2014-04-21 MED ORDER — ONDANSETRON HCL 4 MG/2ML IJ SOLN
INTRAMUSCULAR | Status: DC | PRN
  Administered 2014-04-21: 4 mg via INTRAVENOUS

## 2014-04-21 MED ORDER — ONDANSETRON HCL 4 MG/2ML IJ SOLN: 4.0000 mg | Freq: Once | INTRAMUSCULAR | Status: DC | PRN

## 2014-04-21 MED ORDER — HYDROMORPHONE HCL PF 1 MG/ML IJ SOLN: 0.5000 mg | INTRAMUSCULAR | Status: DC | PRN

## 2014-04-21 MED ORDER — ACETAMINOPHEN 325 MG PO TABS: 325.0000 mg | ORAL_TABLET | ORAL | Status: DC | PRN

## 2014-04-21 MED ORDER — PROPOFOL 10 MG/ML IV BOLUS
INTRAVENOUS | Status: DC | PRN
  Administered 2014-04-21: 200 mg via INTRAVENOUS

## 2014-04-21 SURGICAL SUPPLY — 82 items
ANCH SUT SWLK 19.1 CLS EYLT VT (Anchor) ×1 IMPLANT
ANCH SUT SWLK 19.1X4.75 VT (Anchor) ×2 IMPLANT
ANCHOR BIO SWLOCK 4.75 W/TIG (Anchor) ×1 IMPLANT
ANCHOR PEEK 4.75X19.1 SWLK C (Anchor) ×2 IMPLANT
BANDAGE ADH SHEER 1  50/CT (GAUZE/BANDAGES/DRESSINGS) IMPLANT
BLADE 15 SAFETY STRL DISP (BLADE) ×2 IMPLANT
BLADE AVERAGE 25X9 (BLADE) IMPLANT
BLADE CUTTER MENIS 5.5 (BLADE) IMPLANT
BLADE SURG 15 STRL LF DISP TIS (BLADE) IMPLANT
BLADE SURG 15 STRL SS (BLADE) ×4
BUR EGG 3PK/BX (BURR) IMPLANT
BUR OVAL 6.0 (BURR) ×2 IMPLANT
CANISTER SUCT 3000ML (MISCELLANEOUS) IMPLANT
CANNULA TWIST IN 8.25X7CM (CANNULA) ×1 IMPLANT
CLEANER CAUTERY TIP 5X5 PAD (MISCELLANEOUS) IMPLANT
CUTTER MENISCUS  4.2MM (BLADE) ×1
CUTTER MENISCUS 4.2MM (BLADE) ×1 IMPLANT
DECANTER SPIKE VIAL GLASS SM (MISCELLANEOUS) IMPLANT
DRAPE INCISE IOBAN 66X45 STRL (DRAPES) ×2 IMPLANT
DRAPE STERI 35X30 U-POUCH (DRAPES) ×2 IMPLANT
DRAPE SURG 17X23 STRL (DRAPES) ×2 IMPLANT
DRAPE U-SHAPE 47X51 STRL (DRAPES) ×2 IMPLANT
DRAPE U-SHAPE 76X120 STRL (DRAPES) ×4 IMPLANT
DRSG PAD ABDOMINAL 8X10 ST (GAUZE/BANDAGES/DRESSINGS) ×2 IMPLANT
DURAPREP 26ML APPLICATOR (WOUND CARE) ×2 IMPLANT
ELECT REM PT RETURN 9FT ADLT (ELECTROSURGICAL)
ELECTRODE REM PT RTRN 9FT ADLT (ELECTROSURGICAL) IMPLANT
GAUZE SPONGE 4X4 12PLY STRL (GAUZE/BANDAGES/DRESSINGS) ×2 IMPLANT
GLOVE BIO SURGEON STRL SZ 6.5 (GLOVE) ×1 IMPLANT
GLOVE BIOGEL M STRL SZ7.5 (GLOVE) ×2 IMPLANT
GLOVE BIOGEL PI IND STRL 7.0 (GLOVE) IMPLANT
GLOVE BIOGEL PI IND STRL 8 (GLOVE) ×2 IMPLANT
GLOVE BIOGEL PI INDICATOR 7.0 (GLOVE) ×1
GLOVE BIOGEL PI INDICATOR 8 (GLOVE) ×2
GLOVE EXAM NITRILE MD LF STRL (GLOVE) ×1 IMPLANT
GLOVE ORTHO TXT STRL SZ7.5 (GLOVE) ×2 IMPLANT
GOWN STRL REUS W/TWL MED LVL3 (GOWN DISPOSABLE) ×1 IMPLANT
GOWN STRL REUS W/TWL XL LVL4 (GOWN DISPOSABLE) ×4 IMPLANT
LASSO 90 CVE QUICKPAS (DISPOSABLE) ×1 IMPLANT
MANIFOLD NEPTUNE II (INSTRUMENTS) ×2 IMPLANT
NDL SCORPION (NEEDLE) ×1 IMPLANT
NDL SUT 6 .5 CRC .975X.05 MAYO (NEEDLE) IMPLANT
NEEDLE MAYO TAPER (NEEDLE) ×2
NEEDLE MINI RC 24MM (NEEDLE) IMPLANT
NEEDLE SCORPION (NEEDLE) ×2 IMPLANT
PACK ARTHROSCOPY DSU (CUSTOM PROCEDURE TRAY) ×2 IMPLANT
PACK BASIN DAY SURGERY FS (CUSTOM PROCEDURE TRAY) ×2 IMPLANT
PAD CLEANER CAUTERY TIP 5X5 (MISCELLANEOUS)
PASSER SUT SWANSON 36MM LOOP (INSTRUMENTS) IMPLANT
PENCIL BUTTON HOLSTER BLD 10FT (ELECTRODE) ×1 IMPLANT
SLEEVE SCD COMPRESS KNEE MED (MISCELLANEOUS) ×2 IMPLANT
SLING ARM LRG ADULT FOAM STRAP (SOFTGOODS) ×1 IMPLANT
SLING ARM MED ADULT FOAM STRAP (SOFTGOODS) IMPLANT
SPONGE LAP 4X18 X RAY DECT (DISPOSABLE) ×1 IMPLANT
STRIP CLOSURE SKIN 1/2X4 (GAUZE/BANDAGES/DRESSINGS) ×1 IMPLANT
SUCTION FRAZIER TIP 10 FR DISP (SUCTIONS) IMPLANT
SUT FIBERWIRE #2 38 T-5 BLUE (SUTURE)
SUT FIBERWIRE 3-0 18 TAPR NDL (SUTURE)
SUT PROLENE 1 CT (SUTURE) IMPLANT
SUT PROLENE 3 0 PS 2 (SUTURE) ×2 IMPLANT
SUT TIGER TAPE 7 IN WHITE (SUTURE) IMPLANT
SUT VIC AB 0 CT1 27 (SUTURE)
SUT VIC AB 0 CT1 27XBRD ANBCTR (SUTURE) IMPLANT
SUT VIC AB 0 SH 27 (SUTURE) ×1 IMPLANT
SUT VIC AB 2-0 SH 27 (SUTURE) ×2
SUT VIC AB 2-0 SH 27XBRD (SUTURE) IMPLANT
SUT VIC AB 3-0 SH 27 (SUTURE)
SUT VIC AB 3-0 SH 27X BRD (SUTURE) IMPLANT
SUT VIC AB 3-0 X1 27 (SUTURE) IMPLANT
SUTURE FIBERWR #2 38 T-5 BLUE (SUTURE) IMPLANT
SUTURE FIBERWR 3-0 18 TAPR NDL (SUTURE) IMPLANT
SYR 3ML 23GX1 SAFETY (SYRINGE) IMPLANT
SYR BULB 3OZ (MISCELLANEOUS) IMPLANT
TAPE FIBER 2MM 7IN #2 BLUE (SUTURE) IMPLANT
TAPE PAPER 3X10 WHT MICROPORE (GAUZE/BANDAGES/DRESSINGS) ×2 IMPLANT
TAPE SUT LABRALTAP WHT/BLK (SUTURE) ×1 IMPLANT
TOWEL OR 17X24 6PK STRL BLUE (TOWEL DISPOSABLE) ×2 IMPLANT
TUBE CONNECTING 20X1/4 (TUBING) ×2 IMPLANT
TUBING ARTHROSCOPY IRRIG 16FT (MISCELLANEOUS) ×2 IMPLANT
WAND STAR VAC 90 (SURGICAL WAND) ×2 IMPLANT
WATER STERILE IRR 1000ML POUR (IV SOLUTION) ×2 IMPLANT
YANKAUER SUCT BULB TIP NO VENT (SUCTIONS) IMPLANT

## 2014-04-21 NOTE — Anesthesia Procedure Notes (Addendum)
Anesthesia Regional Block:  Interscalene brachial plexus block  Pre-Anesthetic Checklist: ,, timeout performed, Correct Patient, Correct Site, Correct Laterality, Correct Procedure, Correct Position, site marked, Risks and benefits discussed,  Surgical consent,  Pre-op evaluation,  At surgeon's request and post-op pain management  Laterality: Upper and Right  Prep: chloraprep       Needles:  Injection technique: Single-shot  Needle Type: Echogenic Stimulator Needle          Additional Needles:  Procedures: ultrasound guided (picture in chart) and nerve stimulator Interscalene brachial plexus block  Nerve Stimulator or Paresthesia:  Response: deltoid, 0.4 mA,   Additional Responses:   Narrative:  Start time: 04/21/2014 1:14 PM End time: 04/21/2014 1:20 PM Injection made incrementally with aspirations every 5 mL.  Performed by: Personally  Anesthesiologist: Maple HudsonMoser  Additional Notes: H+P and labs reviewed, risks and benefits discussed with patient, procedure tolerated well without complications   Procedure Name: Intubation Date/Time: 04/21/2014 2:21 PM Performed by: Genevieve NorlanderLINKA, Purvi Ruehl L Pre-anesthesia Checklist: Patient identified, Emergency Drugs available, Suction available, Patient being monitored and Timeout performed Patient Re-evaluated:Patient Re-evaluated prior to inductionOxygen Delivery Method: Circle System Utilized Preoxygenation: Pre-oxygenation with 100% oxygen Intubation Type: IV induction Ventilation: Mask ventilation without difficulty Laryngoscope Size: Miller and 3 Grade View: Grade III Tube type: Oral Tube size: 8.0 mm Number of attempts: 1 Airway Equipment and Method: stylet and oral airway Placement Confirmation: ETT inserted through vocal cords under direct vision,  positive ETCO2 and breath sounds checked- equal and bilateral Tube secured with: Tape Dental Injury: Teeth and Oropharynx as per pre-operative assessment

## 2014-04-21 NOTE — Discharge Instructions (Signed)

## 2014-04-21 NOTE — Brief Op Note (Signed)
04/21/2014  4:20 PM  PATIENT:  Ezekiel SlocumbEric J Barham  48 y.o. male  PRE-OPERATIVE DIAGNOSIS:  RIGHT ROTATOR CUFF TEAR, UNFAVORABLE ACROMIAL ANATOMY  POST-OPERATIVE DIAGNOSIS:  RIGHT ROTATOR CUFF TEAR AND LABRUM TEAR, TYPE 2 SLAP  PROCEDURE:  RIGHT SHOULDER SCOPE WITH LABRAL DEBRIDEMENT, SUBACROMIAL DECOMPRESSION, BICEPS TENODESIS AND ROTATOR CUFF REPAIR  SURGEON:  Surgeon(s) and Role:    * Wyn Forsterobert V Steward Sames Jr., MD - Primary  PHYSICIAN ASSISTANT:   ASSISTANTS: Mallory Shirkobert Dasnoit,P.A-C   ANESTHESIA:   general  EBL:  Total I/O In: 1400 [I.V.:1400] Out: -   BLOOD ADMINISTERED:none  DRAINS: none   LOCAL MEDICATIONS USED:  Plexus block  SPECIMEN:  No Specimen  DISPOSITION OF SPECIMEN:  N/A  COUNTS:  YES  TOURNIQUET:  * No tourniquets in log *  DICTATION: .Other Dictation: Dictation Number [id number]  PLAN OF CARE: Admit for overnight observation  PATIENT DISPOSITION:  PACU - hemodynamically stable.   Delay start of Pharmacological VTE agent (>24hrs) due to surgical blood loss or risk of bleeding: not applicable

## 2014-04-21 NOTE — Progress Notes (Signed)
AssistedDr. Moser with right, ultrasound guided, interscalene  block. Side rails up, monitors on throughout procedure. See vital signs in flow sheet. Tolerated Procedure well.  

## 2014-04-21 NOTE — Anesthesia Preprocedure Evaluation (Signed)
Anesthesia Evaluation  Patient identified by MRN, date of birth, ID band Patient awake    Reviewed: Allergy & Precautions, H&P , NPO status , Patient's Chart, lab work & pertinent test results  History of Anesthesia Complications Negative for: history of anesthetic complications  Airway Mallampati: II TM Distance: >3 FB Neck ROM: Full    Dental  (+) Teeth Intact   Pulmonary sleep apnea ,  breath sounds clear to auscultation        Cardiovascular hypertension, Rhythm:Regular     Neuro/Psych negative neurological ROS  negative psych ROS   GI/Hepatic Neg liver ROS, GERD-  Medicated and Controlled,  Endo/Other  negative endocrine ROS  Renal/GU negative Renal ROS     Musculoskeletal   Abdominal   Peds  Hematology negative hematology ROS (+)   Anesthesia Other Findings   Reproductive/Obstetrics                           Anesthesia Physical Anesthesia Plan  ASA: II  Anesthesia Plan: General and Regional   Post-op Pain Management:    Induction: Intravenous  Airway Management Planned: Oral ETT  Additional Equipment: None  Intra-op Plan:   Post-operative Plan: Extubation in OR  Informed Consent: I have reviewed the patients History and Physical, chart, labs and discussed the procedure including the risks, benefits and alternatives for the proposed anesthesia with the patient or authorized representative who has indicated his/her understanding and acceptance.   Dental advisory given  Plan Discussed with: CRNA and Surgeon  Anesthesia Plan Comments:         Anesthesia Quick Evaluation

## 2014-04-21 NOTE — Anesthesia Postprocedure Evaluation (Signed)
  Anesthesia Post-op Note  Patient: Logan Holt J Bagnall  Procedure(s) Performed: Procedure(s): RIGHT SHOULDER ARTHROSCOPY SUBACROMIAL DECOMPRESSION/DISTAL CLAVICLE RESECTION; OPEN BICEPS TENODESIS AND ROTATOR CUFF REPAIR  (Right)  Patient Location: PACU  Anesthesia Type:GA combined with regional for post-op pain  Level of Consciousness: awake, alert , oriented and patient cooperative  Airway and Oxygen Therapy: Patient Spontanous Breathing  Post-op Pain: mild  Post-op Assessment: Post-op Vital signs reviewed, Patient's Cardiovascular Status Stable, Respiratory Function Stable, Patent Airway, No signs of Nausea or vomiting and Pain level controlled  Post-op Vital Signs: stable  Last Vitals:  Filed Vitals:   04/21/14 1700  BP: 136/87  Pulse: 58  Temp:   Resp: 14    Complications: No apparent anesthesia complications

## 2014-04-21 NOTE — Transfer of Care (Signed)
Immediate Anesthesia Transfer of Care Note  Patient: Logan Holt  Procedure(s) Performed: Procedure(s): RIGHT SHOULDER ARTHROSCOPY SUBACROMIAL DECOMPRESSION/DISTAL CLAVICLE RESECTION; OPEN BICEPS TENODESIS AND ROTATOR CUFF REPAIR  (Right)  Patient Location: PACU  Anesthesia Type:General and GA combined with regional for post-op pain  Level of Consciousness: awake  Airway & Oxygen Therapy: Patient Spontanous Breathing and Patient connected to face mask oxygen  Post-op Assessment: Report given to PACU RN and Post -op Vital signs reviewed and stable  Post vital signs: Reviewed and stable  Complications: No apparent anesthesia complications

## 2014-04-21 NOTE — Op Note (Signed)
[id number] 

## 2014-04-22 ENCOUNTER — Encounter (HOSPITAL_BASED_OUTPATIENT_CLINIC_OR_DEPARTMENT_OTHER): Payer: Self-pay | Admitting: Orthopedic Surgery

## 2014-04-22 MED ORDER — CEFAZOLIN SODIUM-DEXTROSE 2-3 GM-% IV SOLR
INTRAVENOUS | Status: AC
  Filled 2014-04-22: qty 50

## 2014-04-22 NOTE — Op Note (Signed)
NAME:  Logan Holt, Logan Holt                   ACCOUNT NO.:  [id number]  MEDICAL RECORD NO.:  [id number]  LOCATION:                                 FACILITY:  PHYSICIAN:  Katy Fitch. Lorrene Graef, M.D.      DATE OF BIRTH:  DATE OF PROCEDURE:  04/21/2014 DATE OF DISCHARGE:                              OPERATIVE REPORT   PREOPERATIVE DIAGNOSES:  MRI-documented full-thickness rotator cuff tear with extensive tendinopathy of anterior supraspinatus and anterior infraspinatus and unstable superior labrum, rule out type 1 or type 2 SLAP with unfavorable anatomy of acromion and oblique distal clavicle articulation with acromion.  POSTOPERATIVE DIAGNOSES:  Type 2 SLAP lesion with unstable biceps origin, intact subscapularis, 100% supraspinatus rotator cuff tear, and anterior infraspinatus rotator cuff tear.  OPERATIONS: 1. Diagnostic arthroscopy of right glenohumeral joint with labral     debridement and documentation of unstable type 2 SLAP lesion. 2. Arthroscopic biceps tenotomy and labral debridement with     arthroscopic decortication of greater tuberosity in preparation for     rotator cuff repair. 3. Arthroscopic subacromial decompression with acromioplasty,     coracoacromial ligament release, and bursectomy.  A decision was     made not to perform a distal clavicle resection due to the oblique     nature of the distal clavicle and absence of impinging osteophytes. 4. Open hybrid reconstruction of rotator cuff with double row repair. 5. Biceps tenodesis incorporated into rotator cuff repair with fiber     tape grasping sutures.  INDICATIONS:  Logan Holt is a 48 year old gentleman referred through the courtesy of Dr. Elias Else, primary care physician, for evaluation and management of a painful right shoulder.  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  He has had pain in his shoulder for 1 year.  Clinical examination revealed signs of a probable rotator cuff tear with weak  abduction, scaption, and forward flexion.  He had positive impingement sign.  He also, on plain films, had changes at the greater tuberosity, consistent with rotator cuff pathology.  He had an unusual shape to the acromioclavicular joint with a very oblique joint with a large medial bony prominence of the acromion and a very prominent anterior acromion.  We advised Logan Holt to obtain an MRI.  This was accomplished, revealing a full-thickness supraspinatus rotator cuff tear and abnormal signal in the superior labrum.  There was an intact subscapularis abnormal- appearing signal in the infraspinatus and unfavorable acromial anatomy.  We advised Logan Holt to undergo diagnostic arthroscopy, anticipating labral debridement, possible biceps tenotomy or SLAP repair, rotator cuff repair, and subacromial decompression.  After detailed informed consent, he was brought to the operating room at this time.  Preoperatively, he was interviewed by Dr. Maple Hudson of Anesthesia, who provided detailed anesthesia informed consent.  His right arm was marked per protocol with the marking pen as the proper surgical site.  He was subsequently transferred to room 2 of the [facility] where under Dr. Roxy Cedar direct supervision, general endotracheal anesthesia was induced.  He was carefully positioned in the beach-chair position with aid of a torso and head holder designed for  shoulder arthroscopy. Great care was taken to assure that there are no tight bands around his waist or seat belts that caused any compression of the abdomen.  Passive compression devices were applied to his calves for deep vein thrombosis prevention.  A warming blanket was employed.  2 g of Ancef was administered as an IV prophylactic antibiotic.  The right arm, hand, and shoulder were prepped with DuraPrep and draped with impervious arthroscopy drapes.  Following routine surgical time-out, the shoulder was instrumented from a  posterior approach with a blunt trocar through the standard posterior viewing portal.  Diagnostic arthroscopy revealed a degenerative labrum and a type 2 SLAP.  An anterior portal was created under direct vision and we initially considered a possible biceps SLAP repair.  A nerve hook was employed to elevate the biceps and the superior labrum, revealing a positive peel-back test.  We then placed a fiber tape through the anterior portal with a suture Lasso and allowed traction on the biceps. There was a positive pull down test and at least 80% loss of the anchor of the biceps.  Therefore, at age 48, in a very muscular individual, I elected to proceed with a biceps tenodesis.  The biceps was released with cutting cautery and the fiber tapes were used to assure that we did not lose the biceps distally.  This was ultimately placed through the anterior portal.  We inspected the footprint of the rotator cuff.  The subscapularis had an intact footprint with a normal medial wall of the biceps.  The supraspinatus had a 100% PASTA rotator cuff tear off of the greater tuberosity and the proximal 1/2 of the anterior infraspinatus was degenerative and peeled off the greater tuberosity.  The teres minor was normal.  The inferior recess was inspected and found to be normal.  The hyaline articular cartilage surfaces of the glenoid and humeral head were inspected and found to be free of chondromalacia.  The scope was then removed from glenohumeral joint and placed in the subacromial space.  We accomplished a bursectomy followed by release of coracoacromial ligament.  We leveled the acromion to a type 1 morphology.  We carefully inspected the [facility] and determined that the distal clavicle was not problematic.  After decompression and hemostasis, we then created a lateral portal and inspected the cuff tear directly.  We leveled the greater tuberosity with a suction burr, creating a bleeding bone  surface.  We pondered whether or not an arthroscopic repair was optimum for Logan Holt.  Given the need for biceps tenodesis which we chose to perform in a supra pectoral level, I ultimately decided to make a 2-cm muscle-splitting incision.  This was created between the anterior/middle thirds of the deltoid, exposing the cuff tear.  We obtained hemostasis and subsequently performed a double row repair with a medial Bio corkscrew at the central aspect of the supraspinatus.  We subsequently placed two fiber loop tapes in  mattress fashion and then placed the fiber wire suture slightly more medially to create a medial footprint mattress suture.  We performed a tenodesis of the biceps by placing multiple grasping sutures of fiber tape in the biceps, resecting the redundant portion of the intra- articular biceps tendon.  We then placed a mattress suture through the anterior supraspinatus, securing the biceps by tying the fiber tape.  We then performed a double diamondback repair to two lateral swivel locks, insetting the biceps, the supraspinatus, infraspinatus, and anatomic footprint on the decorticated greater tuberosity.  An excellent low-profile repair was achieved.  The scope was placed in glenohumeral joint and used to irrigate all clot and debris followed by photographic documentation and the anatomic footprint of the reconstructed supraspinatus, infraspinatus.  The scope was removed from glenohumeral joint and a muscle-splitting incision repaired with interrupted suture of 0 Vicryl in the deltoid followed by subcutaneous 0 and 2-0 Vicryl in the skin followed by intradermal 3-0 Prolene and Steri-Strips.  There were no apparent complications.  Logan Holt tolerated the surgery and anesthesia well.  He was placed in a sling, awakened from general anesthesia, and transferred to the recovery room with stable signs.  For aftercare, he will be admitted to the recovery care center  for monitoring of his vital signs and appropriate analgesics in the form of p.o. and IV Dilaudid and prophylactic antibiotics in the form of Ancef 2 g IV q.6 hours.     Katy Fitchobert V. Meriah Shands, M.D.     RVS/MEDQ  D:  04/21/2014  T:  04/22/2014  Job:  [id number]

## 2014-04-24 NOTE — Progress Notes (Signed)
Pt was seen in consultation at the day surgery center.  He is POD #3 s/p shoulder surgery with Dr. Teressa SenterSypher.  He is complaining of a persistent sore throat.  Upon exam there is diffuse bruising of both tonsilar pillars.  Minimal swelling of pharyngeal tissues.  Tissues appear clean with no purulence or signs of infection.  He was advised to use Magic Mouthwash PRN as prescribed to him.  I would expect that he has significant improvement in just a few days.  If he has no improvement over the next few days, he was advised to see an ENT physician.

## 2014-04-24 NOTE — Addendum Note (Signed)
Addendum created 04/24/14 1243 by Achille RichAdam Dwain Huhn, MD   Modules edited: Clinical Notes   Clinical Notes:  File: [id number]; Pend: [id number]

## 2014-05-02 NOTE — Addendum Note (Signed)
Addendum created 05/02/14 1929 by Corky Soxhris Broughton Eppinger, MD   Modules edited: Anesthesia Attestations

## 2015-01-24 ENCOUNTER — Ambulatory Visit (INDEPENDENT_AMBULATORY_CARE_PROVIDER_SITE_OTHER): Payer: 59 | Admitting: Family Medicine

## 2015-01-24 VITALS — BP 124/90 | HR 68 | Temp 98.2°F | Resp 16 | Ht 65.0 in | Wt 189.1 lb

## 2015-01-24 DIAGNOSIS — J029 Acute pharyngitis, unspecified: Secondary | ICD-10-CM

## 2015-01-24 DIAGNOSIS — Z862 Personal history of diseases of the blood and blood-forming organs and certain disorders involving the immune mechanism: Secondary | ICD-10-CM

## 2015-01-24 DIAGNOSIS — J069 Acute upper respiratory infection, unspecified: Secondary | ICD-10-CM

## 2015-01-24 LAB — POCT RAPID STREP A (OFFICE): Rapid Strep A Screen: NEGATIVE

## 2015-01-24 MED ORDER — MAGIC MOUTHWASH W/LIDOCAINE: 5.0000 mL | Freq: Four times a day (QID) | ORAL | Status: DC | PRN

## 2015-01-24 NOTE — Patient Instructions (Addendum)
Saline nasal spray atleast 4 times per day, over the counter mucinex or mucinex DM, drink plenty of fluids. Lozenges for sore throat, or if needed- can fill  Magic mouthwash to gargle and spit.  Voice rest, and see other information below if you do lose your voice.  If any fever, wheeze or worsening chest symptoms - return for recheck.  Return to the clinic or go to the nearest emergency room if any of your symptoms worsen or new symptoms occur.  Sore Throat A sore throat is pain, burning, irritation, or scratchiness of the throat. There is often pain or tenderness when swallowing or talking. A sore throat may be accompanied by other symptoms, such as coughing, sneezing, fever, and swollen neck glands. A sore throat is often the first sign of another sickness, such as a cold, flu, strep throat, or mononucleosis (commonly known as mono). Most sore throats go away without medical treatment. CAUSES  The most common causes of a sore throat include:  A viral infection, such as a cold, flu, or mono.  A bacterial infection, such as strep throat, tonsillitis, or whooping cough.  Seasonal allergies.  Dryness in the air.  Irritants, such as smoke or pollution.  Gastroesophageal reflux disease (GERD). HOME CARE INSTRUCTIONS   Only take over-the-counter medicines as directed by your caregiver.  Drink enough fluids to keep your urine clear or pale yellow.  Rest as needed.  Try using throat sprays, lozenges, or sucking on hard candy to ease any pain (if older than 4 years or as directed).  Sip warm liquids, such as broth, herbal tea, or warm water with honey to relieve pain temporarily. You may also eat or drink cold or frozen liquids such as frozen ice pops.  Gargle with salt water (mix 1 tsp salt with 8 oz of water).  Do not smoke and avoid secondhand smoke.  Put a cool-mist humidifier in your bedroom at night to moisten the air. You can also turn on a hot shower and sit in the bathroom with  the door closed for 5-10 minutes. SEEK IMMEDIATE MEDICAL CARE IF:  You have difficulty breathing.  You are unable to swallow fluids, soft foods, or your saliva.  You have increased swelling in the throat.  Your sore throat does not get better in 7 days.  You have nausea and vomiting.  You have a fever or persistent symptoms for more than 2-3 days.  You have a fever and your symptoms suddenly get worse. MAKE SURE YOU:   Understand these instructions.  Will watch your condition.  Will get help right away if you are not doing well or get worse. Document Released: 01/11/2005 Document Revised: 11/20/2012 Document Reviewed: 08/11/2012 [facility] Patient Information 2015 [place]. This information is not intended to replace advice given to you by your health care provider. Make sure you discuss any questions you have with your health care provider.   Upper Respiratory Infection, Adult An upper respiratory infection (URI) is also sometimes known as the common cold. The upper respiratory tract includes the nose, sinuses, throat, trachea, and bronchi. Bronchi are the airways leading to the lungs. Most people improve within 1 week, but symptoms can last up to 2 weeks. A residual cough may last even longer.  CAUSES Many different viruses can infect the tissues lining the upper respiratory tract. The tissues become irritated and inflamed and often become very moist. Mucus production is also common. A cold is contagious. You can easily spread the virus to  others by oral contact. This includes kissing, sharing a glass, coughing, or sneezing. Touching your mouth or nose and then touching a surface, which is then touched by another person, can also spread the virus. SYMPTOMS  Symptoms typically develop 1 to 3 days after you come in contact with a cold virus. Symptoms vary from person to person. They may include:  Runny nose.  Sneezing.  Nasal congestion.  Sinus irritation.  Sore  throat.  Loss of voice (laryngitis).  Cough.  Fatigue.  Muscle aches.  Loss of appetite.  Headache.  Low-grade fever. DIAGNOSIS  You might diagnose your own cold based on familiar symptoms, since most people get a cold 2 to 3 times a year. Your caregiver can confirm this based on your exam. Most importantly, your caregiver can check that your symptoms are not due to another disease such as strep throat, sinusitis, pneumonia, asthma, or epiglottitis. Blood tests, throat tests, and X-rays are not necessary to diagnose a common cold, but they may sometimes be helpful in excluding other more serious diseases. Your caregiver will decide if any further tests are required. RISKS AND COMPLICATIONS  You may be at risk for a more severe case of the common cold if you smoke cigarettes, have chronic heart disease (such as heart failure) or lung disease (such as asthma), or if you have a weakened immune system. The very young and very old are also at risk for more serious infections. Bacterial sinusitis, middle ear infections, and bacterial pneumonia can complicate the common cold. The common cold can worsen asthma and chronic obstructive pulmonary disease (COPD). Sometimes, these complications can require emergency medical care and may be life-threatening. PREVENTION  The best way to protect against getting a cold is to practice good hygiene. Avoid oral or hand contact with people with cold symptoms. Wash your hands often if contact occurs. There is no clear evidence that vitamin C, vitamin E, echinacea, or exercise reduces the chance of developing a cold. However, it is always recommended to get plenty of rest and practice good nutrition. TREATMENT  Treatment is directed at relieving symptoms. There is no cure. Antibiotics are not effective, because the infection is caused by a virus, not by bacteria. Treatment may include:  Increased fluid intake. Sports drinks offer valuable electrolytes, sugars, and  fluids.  Breathing heated mist or steam (vaporizer or shower).  Eating chicken soup or other clear broths, and maintaining good nutrition.  Getting plenty of rest.  Using gargles or lozenges for comfort.  Controlling fevers with ibuprofen or acetaminophen as directed by your caregiver.  Increasing usage of your inhaler if you have asthma. Zinc gel and zinc lozenges, taken in the first 24 hours of the common cold, can shorten the duration and lessen the severity of symptoms. Pain medicines may help with fever, muscle aches, and throat pain. A variety of non-prescription medicines are available to treat congestion and runny nose. Your caregiver can make recommendations and may suggest nasal or lung inhalers for other symptoms.  HOME CARE INSTRUCTIONS   Only take over-the-counter or prescription medicines for pain, discomfort, or fever as directed by your caregiver.  Use a warm mist humidifier or inhale steam from a shower to increase air moisture. This may keep secretions moist and make it easier to breathe.  Drink enough water and fluids to keep your urine clear or pale yellow.  Rest as needed.  Return to work when your temperature has returned to normal or as your caregiver advises. You may  need to stay home longer to avoid infecting others. You can also use a face mask and careful hand washing to prevent spread of the virus. SEEK MEDICAL CARE IF:   After the first few days, you feel you are getting worse rather than better.  You need your caregiver's advice about medicines to control symptoms.  You develop chills, worsening shortness of breath, or brown or red sputum. These may be signs of pneumonia.  You develop yellow or brown nasal discharge or pain in the face, especially when you bend forward. These may be signs of sinusitis.  You develop a fever, swollen neck glands, pain with swallowing, or white areas in the back of your throat. These may be signs of strep throat. SEEK  IMMEDIATE MEDICAL CARE IF:   You have a fever.  You develop severe or persistent headache, ear pain, sinus pain, or chest pain.  You develop wheezing, a prolonged cough, cough up blood, or have a change in your usual mucus (if you have chronic lung disease).  You develop sore muscles or a stiff neck. Document Released: 05/30/2001 Document Revised: 02/26/2012 Document Reviewed: 03/11/2014 [facility] Patient Information 2015 [place], [place]. This information is not intended to replace advice given to you by your health care provider. Make sure you discuss any questions you have with your health care provider.   Laryngitis At the top of your windpipe is your voice box. It is the source of your voice. Inside your voice box are 2 bands of muscles called vocal cords. When you breathe, your vocal cords are relaxed and open so that air can get into the lungs. When you decide to say something, these cords come together and vibrate. The sound from these vibrations goes into your throat and comes out through your mouth as sound. Laryngitis is an inflammation of the vocal cords that causes hoarseness, cough, loss of voice, sore throat, and dry throat. Laryngitis can be temporary (acute) or long-term (chronic). Most cases of acute laryngitis improve with time.Chronic laryngitis lasts for more than 3 weeks. CAUSES Laryngitis can often be related to excessive smoking, talking, or yelling, as well as inhalation of toxic fumes and allergies. Acute laryngitis is usually caused by a viral infection, vocal strain, measles or mumps, or bacterial infections. Chronic laryngitis is usually caused by vocal cord strain, vocal cord injury, postnasal drip, growths on the vocal cords, or acid reflux. SYMPTOMS   Cough.  Sore throat.  Dry throat. RISK FACTORS  Respiratory infections.  Exposure to irritating substances, such as cigarette smoke, excessive amounts of alcohol, stomach acids, and workplace  chemicals.  Voice trauma, such as vocal cord injury from shouting or speaking too loud. DIAGNOSIS  Your cargiver will perform a physical exam. During the physical exam, your caregiver will examine your throat. The most common sign of laryngitis is hoarseness. Laryngoscopy may be necessary to confirm the diagnosis of this condition. This procedure allows your caregiver to look into the larynx. HOME CARE INSTRUCTIONS  Drink enough fluids to keep your urine clear or pale yellow.  Rest until you no longer have symptoms or as directed by your caregiver.  Breathe in moist air.  Take all medicine as directed by your caregiver.  Do not smoke.  Talk as little as possible (this includes whispering).  Write on paper instead of talking until your voice is back to normal.  Follow up with your caregiver if your condition has not improved after 10 days. SEEK MEDICAL CARE IF:   You  have trouble breathing.  You cough up blood.  You have persistent fever.  You have increasing pain.  You have difficulty swallowing. MAKE SURE YOU:  Understand these instructions.  Will watch your condition.  Will get help right away if you are not doing well or get worse. Document Released: 12/04/2005 Document Revised: 02/26/2012 Document Reviewed: 02/09/2011 [facility] Patient Information 2015 [place], [place]. This information is not intended to replace advice given to you by your health care provider. Make sure you discuss any questions you have with your health care provider.

## 2015-01-24 NOTE — Progress Notes (Addendum)
Subjective:    Patient ID: Logan Holt, male    DOB: 31-Aug-1966, 49 y.o.   MRN: [id number] This chart was scribed for Meredith StaggersJeffrey Hargun Spurling, MD by Jolene Provostobert Halas, Medical Scribe. This patient was seen in Room 5 and the patient's care was started a 2:20 PM.   Chief Complaint  Patient presents with  . Sore Throat    several days.  PND, headache    HPI HPI Comments: Logan Holt is a 49 y.o. male with a hx of sarcoidosis in lungs who [facility] complaining of sore throat for the last two days with associated pounding HA and chest congestion. Pt states that last night he started losing his voice. Pt states he coughs occasionally when he tries to bring up the congestion in his chest. Pt endorses sleep disturbance, sinus congestion, and suspected post nasal drip. Pt denies fever and sick contacts. Pt has taken mucinex PTA without relief.     Patient Active Problem List   Diagnosis Date Noted  . Rotator cuff tear 04/21/2014  . OSA (obstructive sleep apnea) 01/22/2014  . PVC's (premature ventricular contractions) 01/22/2014  . PALPITATIONS 10/08/2009  . SARCOIDOSIS 09/15/2009   Past Medical History  Diagnosis Date  . Sarcoidosis   . History of hypertension     was taken off med. after losing weight  . OSA (obstructive sleep apnea)     was diagnosed when weight was 205; does not use CPAP  . GERD (gastroesophageal reflux disease)     prn Nexium  . Rotator cuff tear 03/2014    right  . History of kidney stones   . Blood transfusion without reported diagnosis   . Depression    Past Surgical History  Procedure Laterality Date  . Bronchoscopy    . Knee arthroscopy Left   . Orif femur fracture Left   . Shoulder arthroscopy with rotator cuff repair and subacromial decompression Right 04/21/2014    Procedure: RIGHT SHOULDER ARTHROSCOPY SUBACROMIAL DECOMPRESSION/DISTAL CLAVICLE RESECTION; OPEN BICEPS TENODESIS AND ROTATOR CUFF REPAIR ;  Surgeon: Wyn Forsterobert V Sypher Jr., MD;  Location: [facility];  Service: Orthopedics;  Laterality: Right;   No Known Allergies Prior to Admission medications   Not on File   History   Social History  . Marital Status: Single    Spouse Name: N/A    Number of Children: N/A  . Years of Education: N/A   Occupational History  . Not on file.   Social History Main Topics  . Smoking status: Never Smoker   . Smokeless tobacco: Never Used  . Alcohol Use: No  . Drug Use: No  . Sexual Activity: Not on file   Other Topics Concern  . Not on file   Social History Narrative    Review of Systems  Constitutional: Negative for fever and chills.  HENT: Positive for congestion, sinus pressure and sore throat.   Respiratory: Positive for cough.   Psychiatric/Behavioral: Positive for sleep disturbance.       Objective:   Physical Exam  Constitutional: He is oriented to person, place, and time. He appears well-developed and well-nourished.  HENT:  Head: Normocephalic and atraumatic.  Right Ear: Tympanic membrane, external ear and ear canal normal.  Left Ear: Tympanic membrane, external ear and ear canal normal.  Nose: No rhinorrhea.  Mouth/Throat: Oropharynx is clear and moist and mucous membranes are normal. No oropharyngeal exudate or posterior oropharyngeal erythema.  No erythema or edema in throat. Sinuses non tender,  no lymphadenopathy.   Eyes: Conjunctivae are normal. Pupils are equal, round, and reactive to light.  Neck: Neck supple. No JVD present.  Cardiovascular: Normal rate, regular rhythm, normal heart sounds and intact distal pulses.   No murmur heard. Pulmonary/Chest: Effort normal and breath sounds normal. No respiratory distress. He has no wheezes. He has no rhonchi. He has no rales.  Abdominal: Soft. There is no tenderness.  Lymphadenopathy:    He has no cervical adenopathy.  Neurological: He is alert and oriented to person, place, and time.  Skin: Skin is warm and dry. No rash noted.  Psychiatric: He has a  normal mood and affect. His behavior is normal.  Nursing note and vitals reviewed.     Filed Vitals:   01/24/15 1413  BP: 124/90  Pulse: 68  Temp: 98.2 F (36.8 C)  TempSrc: Oral  Resp: 16  Height:  (1.651 m)  Weight: 189 lb 2 oz (85.787 kg)  SpO2: 97%   Results for orders placed or performed in visit on 01/24/15  POCT rapid strep A  Result Value Ref Range   Rapid Strep A Screen Negative Negative       Assessment & Plan:   Logan Holt is a 49 y.o. male Sore throat - Plan: POCT rapid strep A, Culture, Group A Strep, Alum & Mag Hydroxide-Simeth (MAGIC MOUTHWASH W/LIDOCAINE) SOLN  Acute upper respiratory infection  History of sarcoidosis  Suspected uri/viral illness.  Reassuring exam.  Throat cx sent, but doubt bacterial infection. Sx care with lozenges, fluids, voice rest and MMW if needed. rtc precautions, and although chest clear today - rtc precautions with hx of sarcoidosis discussed.  No orders of the defined types were placed in this encounter.   Patient Instructions  Saline nasal spray atleast 4 times per day, over the counter mucinex or mucinex DM, drink plenty of fluids. Voice rest, and see other information below if you do lose your voice.  If any fever, wheeze or worsening chest symptoms - return for recheck.  Return to the clinic or go to the nearest emergency room if any of your symptoms worsen or new symptoms occur.  Upper Respiratory Infection, Adult An upper respiratory infection (URI) is also sometimes known as the common cold. The upper respiratory tract includes the nose, sinuses, throat, trachea, and bronchi. Bronchi are the airways leading to the lungs. Most people improve within 1 week, but symptoms can last up to 2 weeks. A residual cough may last even longer.  CAUSES Many different viruses can infect the tissues lining the upper respiratory tract. The tissues become irritated and inflamed and often become very moist. Mucus production is also  common. A cold is contagious. You can easily spread the virus to others by oral contact. This includes kissing, sharing a glass, coughing, or sneezing. Touching your mouth or nose and then touching a surface, which is then touched by another person, can also spread the virus. SYMPTOMS  Symptoms typically develop 1 to 3 days after you come in contact with a cold virus. Symptoms vary from person to person. They may include:  Runny nose.  Sneezing.  Nasal congestion.  Sinus irritation.  Sore throat.  Loss of voice (laryngitis).  Cough.  Fatigue.  Muscle aches.  Loss of appetite.  Headache.  Low-grade fever. DIAGNOSIS  You might diagnose your own cold based on familiar symptoms, since most people get a cold 2 to 3 times a year. Your caregiver can confirm this based on your  exam. Most importantly, your caregiver can check that your symptoms are not due to another disease such as strep throat, sinusitis, pneumonia, asthma, or epiglottitis. Blood tests, throat tests, and X-rays are not necessary to diagnose a common cold, but they may sometimes be helpful in excluding other more serious diseases. Your caregiver will decide if any further tests are required. RISKS AND COMPLICATIONS  You may be at risk for a more severe case of the common cold if you smoke cigarettes, have chronic heart disease (such as heart failure) or lung disease (such as asthma), or if you have a weakened immune system. The very young and very old are also at risk for more serious infections. Bacterial sinusitis, middle ear infections, and bacterial pneumonia can complicate the common cold. The common cold can worsen asthma and chronic obstructive pulmonary disease (COPD). Sometimes, these complications can require emergency medical care and may be life-threatening. PREVENTION  The best way to protect against getting a cold is to practice good hygiene. Avoid oral or hand contact with people with cold symptoms. Wash your  hands often if contact occurs. There is no clear evidence that vitamin C, vitamin E, echinacea, or exercise reduces the chance of developing a cold. However, it is always recommended to get plenty of rest and practice good nutrition. TREATMENT  Treatment is directed at relieving symptoms. There is no cure. Antibiotics are not effective, because the infection is caused by a virus, not by bacteria. Treatment may include:  Increased fluid intake. Sports drinks offer valuable electrolytes, sugars, and fluids.  Breathing heated mist or steam (vaporizer or shower).  Eating chicken soup or other clear broths, and maintaining good nutrition.  Getting plenty of rest.  Using gargles or lozenges for comfort.  Controlling fevers with ibuprofen or acetaminophen as directed by your caregiver.  Increasing usage of your inhaler if you have asthma. Zinc gel and zinc lozenges, taken in the first 24 hours of the common cold, can shorten the duration and lessen the severity of symptoms. Pain medicines may help with fever, muscle aches, and throat pain. A variety of non-prescription medicines are available to treat congestion and runny nose. Your caregiver can make recommendations and may suggest nasal or lung inhalers for other symptoms.  HOME CARE INSTRUCTIONS   Only take over-the-counter or prescription medicines for pain, discomfort, or fever as directed by your caregiver.  Use a warm mist humidifier or inhale steam from a shower to increase air moisture. This may keep secretions moist and make it easier to breathe.  Drink enough water and fluids to keep your urine clear or pale yellow.  Rest as needed.  Return to work when your temperature has returned to normal or as your caregiver advises. You may need to stay home longer to avoid infecting others. You can also use a face mask and careful hand washing to prevent spread of the virus. SEEK MEDICAL CARE IF:   After the first few days, you feel you are  getting worse rather than better.  You need your caregiver's advice about medicines to control symptoms.  You develop chills, worsening shortness of breath, or brown or red sputum. These may be signs of pneumonia.  You develop yellow or brown nasal discharge or pain in the face, especially when you bend forward. These may be signs of sinusitis.  You develop a fever, swollen neck glands, pain with swallowing, or white areas in the back of your throat. These may be signs of strep throat. SEEK IMMEDIATE  MEDICAL CARE IF:   You have a fever.  You develop severe or persistent headache, ear pain, sinus pain, or chest pain.  You develop wheezing, a prolonged cough, cough up blood, or have a change in your usual mucus (if you have chronic lung disease).  You develop sore muscles or a stiff neck. Document Released: 05/30/2001 Document Revised: 02/26/2012 Document Reviewed: 03/11/2014 [facility] Patient Information 2015 [place], [place]. This information is not intended to replace advice given to you by your health care provider. Make sure you discuss any questions you have with your health care provider.   Laryngitis At the top of your windpipe is your voice box. It is the source of your voice. Inside your voice box are 2 bands of muscles called vocal cords. When you breathe, your vocal cords are relaxed and open so that air can get into the lungs. When you decide to say something, these cords come together and vibrate. The sound from these vibrations goes into your throat and comes out through your mouth as sound. Laryngitis is an inflammation of the vocal cords that causes hoarseness, cough, loss of voice, sore throat, and dry throat. Laryngitis can be temporary (acute) or long-term (chronic). Most cases of acute laryngitis improve with time.Chronic laryngitis lasts for more than 3 weeks. CAUSES Laryngitis can often be related to excessive smoking, talking, or yelling, as well as inhalation of toxic  fumes and allergies. Acute laryngitis is usually caused by a viral infection, vocal strain, measles or mumps, or bacterial infections. Chronic laryngitis is usually caused by vocal cord strain, vocal cord injury, postnasal drip, growths on the vocal cords, or acid reflux. SYMPTOMS   Cough.  Sore throat.  Dry throat. RISK FACTORS  Respiratory infections.  Exposure to irritating substances, such as cigarette smoke, excessive amounts of alcohol, stomach acids, and workplace chemicals.  Voice trauma, such as vocal cord injury from shouting or speaking too loud. DIAGNOSIS  Your cargiver will perform a physical exam. During the physical exam, your caregiver will examine your throat. The most common sign of laryngitis is hoarseness. Laryngoscopy may be necessary to confirm the diagnosis of this condition. This procedure allows your caregiver to look into the larynx. HOME CARE INSTRUCTIONS  Drink enough fluids to keep your urine clear or pale yellow.  Rest until you no longer have symptoms or as directed by your caregiver.  Breathe in moist air.  Take all medicine as directed by your caregiver.  Do not smoke.  Talk as little as possible (this includes whispering).  Write on paper instead of talking until your voice is back to normal.  Follow up with your caregiver if your condition has not improved after 10 days. SEEK MEDICAL CARE IF:   You have trouble breathing.  You cough up blood.  You have persistent fever.  You have increasing pain.  You have difficulty swallowing. MAKE SURE YOU:  Understand these instructions.  Will watch your condition.  Will get help right away if you are not doing well or get worse. Document Released: 12/04/2005 Document Revised: 02/26/2012 Document Reviewed: 02/09/2011 [facility] Patient Information 2015 [place], [place]. This information is not intended to replace advice given to you by your health care provider. Make sure you discuss any  questions you have with your health care provider.     I personally performed the services described in this documentation, which was scribed in my presence. The recorded information has been reviewed and considered, and addended by me as needed.

## 2015-01-26 LAB — CULTURE, GROUP A STREP: Organism ID, Bacteria: NORMAL

## 2017-08-13 ENCOUNTER — Ambulatory Visit (INDEPENDENT_AMBULATORY_CARE_PROVIDER_SITE_OTHER): Payer: 59 | Admitting: Physician Assistant

## 2017-08-13 ENCOUNTER — Ambulatory Visit (HOSPITAL_COMMUNITY)
Admission: RE | Admit: 2017-08-13 | Discharge: 2017-08-13 | Disposition: A | Discharge status: Home / Self Care | Payer: 59 | Source: Ambulatory Visit | Attending: Physician Assistant | Admitting: Physician Assistant

## 2017-08-13 ENCOUNTER — Encounter: Payer: Self-pay | Admitting: Physician Assistant

## 2017-08-13 VITALS — BP 143/90 | HR 72 | Temp 98.0°F | Resp 16 | Ht 65.0 in | Wt 201.0 lb

## 2017-08-13 DIAGNOSIS — R109 Unspecified abdominal pain: Secondary | ICD-10-CM

## 2017-08-13 DIAGNOSIS — N200 Calculus of kidney: Secondary | ICD-10-CM | POA: Insufficient documentation

## 2017-08-13 DIAGNOSIS — R1031 Right lower quadrant pain: Secondary | ICD-10-CM | POA: Insufficient documentation

## 2017-08-13 DIAGNOSIS — N400 Enlarged prostate without lower urinary tract symptoms: Secondary | ICD-10-CM

## 2017-08-13 DIAGNOSIS — Z23 Encounter for immunization: Secondary | ICD-10-CM

## 2017-08-13 LAB — POCT URINALYSIS DIP (MANUAL ENTRY)
BILIRUBIN UA: NEGATIVE mg/dL
Bilirubin, UA: NEGATIVE
Glucose, UA: NEGATIVE mg/dL
Leukocytes, UA: NEGATIVE
Nitrite, UA: NEGATIVE
PROTEIN UA: NEGATIVE mg/dL
Spec Grav, UA: 1.01 (ref 1.010–1.025)
UROBILINOGEN UA: 0.2 U/dL
pH, UA: 6.5 (ref 5.0–8.0)

## 2017-08-13 MED ORDER — TAMSULOSIN HCL 0.4 MG PO CAPS: 0.4000 mg | ORAL_CAPSULE | Freq: Every day | ORAL | 3 refills | Status: DC

## 2017-08-13 MED ORDER — OXYCODONE-ACETAMINOPHEN 5-325 MG PO TABS: 1.0000 | ORAL_TABLET | Freq: Four times a day (QID) | ORAL | 0 refills | Status: DC | PRN

## 2017-08-13 MED ORDER — KETOROLAC TROMETHAMINE 60 MG/2ML IM SOLN
60.0000 mg | Freq: Once | INTRAMUSCULAR | Status: AC
  Administered 2017-08-13: 60 mg via INTRAMUSCULAR

## 2017-08-13 NOTE — Patient Instructions (Addendum)
Go to [employer] at 1pm so you can begin to drink the contrast for your scheduled CT at 3pm.   Looks like a kidney stone.  I would like you to take the flomax as prescribed.  You can take th pain medicine as needed.  I would like you to make sure you are hydrating well.  Use the filter whenever you urinate.  I would like to see you in 5 days to next week, for recheck I will contact you with the blood work.    Kidney Stones Kidney stones (urolithiasis) are rock-like masses that form inside of the kidneys. Kidneys are organs that make pee (urine). A kidney stone can cause very bad pain and can block the flow of pee. The stone usually leaves your body (passes) through your pee. You may need to have a doctor take out the stone. Follow these instructions at home: Eating and drinking  Drink enough fluid to keep your pee clear or pale yellow. This will help you pass the stone.  If told by your doctor, change the foods you eat (your diet). This may include: ? Limiting how much salt (sodium) you eat. ? Eating more fruits and vegetables. ? Limiting how much meat, poultry, fish, and eggs you eat.  Follow instructions from your doctor about eating or drinking restrictions. General instructions  Collect pee samples as told by your doctor. You may need to collect a pee sample: ? 24 hours after a stone comes out. ? 8-12 weeks after a stone comes out, and every 6-12 months after that.  Strain your pee every time you pee (urinate), for as long as told. Use the strainer that your doctor recommends.  Do not throw out the stone. Keep it so that it can be tested by your doctor.  Take over-the-counter and prescription medicines only as told by your doctor.  Keep all follow-up visits as told by your doctor. This is important. You may need follow-up tests. Preventing kidney stones To prevent another kidney stone:  Drink enough fluid to keep your pee clear or pale yellow. This is the best way to prevent  kidney stones.  Eat healthy foods.  Avoid certain foods as told by your doctor. You may be told to eat less protein.  Stay at a healthy weight.  Contact a doctor if:  You have pain that gets worse or does not get better with medicine. Get help right away if:  You have a fever or chills.  You get very bad pain.  You get new pain in your belly (abdomen).  You pass out (faint).  You cannot pee. This information is not intended to replace advice given to you by your health care provider. Make sure you discuss any questions you have with your health care provider. Document Released: 05/22/2008 Document Revised: 08/22/2016 Document Reviewed: 08/22/2016 Elsevier Interactive Patient Education  2017 [employer].    IF you received an x-ray today, you will receive an invoice from [facility]. Please contact [facility] at [phone] with questions or concerns regarding your invoice.   IF you received labwork today, you will receive an invoice from [facility]. Please contact [facility] at [phone] with questions or concerns regarding your invoice.   Our billing staff will not be able to assist you with questions regarding bills from these companies.  You will be contacted with the lab results as soon as they are available. The fastest way to get your results is to activate your My Chart account.  Instructions are located on the last page of this paperwork. If you have not heard from [place] regarding the results in 2 weeks, please contact this office.

## 2017-08-13 NOTE — Progress Notes (Signed)
PRIMARY CARE AT [facility] [address] [place] [address] [phone]  Date:  08/13/2017   Name:  Logan Holt   DOB:  [birth year]   MRN:  [id number]  PCP:  Patient, No Pcp Per    History of Present Illness:  Logan Holt is a 51 y.o. male patient who presents to PCP with  Chief Complaint  Patient presents with  . Flank Pain    right side/ x 2 days. pt has hx of kidney stones     Woken up with right flank pain  Today it occurred again, pain moving down, and radiating to testicles.  He opened belt buckle  He is dry heaving.  10/10 pain.  4/10. 4 episodes of urination within the last 5 hours.  No hematuria.  No dysuria.   Urgency without much of a stream.  No fever.   BM normal.  No blood in stool or diarrhea, but mild constipation in the last 4 days.  He has not taken anyting for pain. He has had 2 episodes of kidney stones. These passed.    Patient Active Problem List   Diagnosis Date Noted  . Rotator cuff tear 04/21/2014  . OSA (obstructive sleep apnea) 01/22/2014  . PVC's (premature ventricular contractions) 01/22/2014  . PALPITATIONS 10/08/2009  . SARCOIDOSIS 09/15/2009    Past Medical History:  Diagnosis Date  . Blood transfusion without reported diagnosis   . Depression   . GERD (gastroesophageal reflux disease)    prn Nexium  . History of hypertension    was taken off med. after losing weight  . History of kidney stones   . OSA (obstructive sleep apnea)    was diagnosed when weight was 205; does not use CPAP  . Rotator cuff tear 03/2014   right  . Sarcoidosis     Past Surgical History:  Procedure Laterality Date  . BRONCHOSCOPY    . KNEE ARTHROSCOPY Left   . ORIF FEMUR FRACTURE Left   . SHOULDER ARTHROSCOPY WITH ROTATOR CUFF REPAIR AND SUBACROMIAL DECOMPRESSION Right 04/21/2014   Procedure: RIGHT SHOULDER ARTHROSCOPY SUBACROMIAL DECOMPRESSION/DISTAL CLAVICLE RESECTION; OPEN BICEPS TENODESIS AND ROTATOR CUFF REPAIR ;  Surgeon: Wyn Forster., MD;   Location: [facility];  Service: Orthopedics;  Laterality: Right;    Social History  Substance Use Topics  . Smoking status: Never Smoker  . Smokeless tobacco: Never Used  . Alcohol use No    History reviewed. No pertinent family history.  No Known Allergies  Medication list has been reviewed and updated.  No current outpatient prescriptions on file prior to visit.   No current facility-administered medications on file prior to visit.     ROS ROS otherwise unremarkable unless listed above.  Physical Examination: BP (!) 153/106   Pulse 72   Temp 98 F (36.7 C) (Oral)   Resp 16   Ht 5\' 5"  (1.651 m)   Wt 201 lb (91.2 kg)   SpO2 98%   BMI 33.45 kg/m  Ideal Body Weight: Weight in (lb) to have BMI = 25: 149.9  Physical Exam  Constitutional: He is oriented to person, place, and time. He appears well-developed and well-nourished. No distress.  HENT:  Head: Normocephalic and atraumatic.  Eyes: Pupils are equal, round, and reactive to light. Conjunctivae and EOM are normal.  Cardiovascular: Normal rate, regular rhythm and normal heart sounds.  Exam reveals no friction rub.   No murmur heard. Pulmonary/Chest: Effort normal. No respiratory distress. He has  no wheezes.  Neurological: He is alert and oriented to person, place, and time.  Skin: Skin is warm and dry. He is not diaphoretic.  Psychiatric: He has a normal mood and affect. His behavior is normal.     Assessment and Plan: Logan Holt is a 51 y.o. male who is here today for cc of flank pain and hematuria Consistent with kidney stone.  Placed stat ct to obtain size and location of the stones.  toradol given prior to discharge Advised hydration, flomax as this is a kidney stone that should pass.  He will return for alarming symptoms discussed.  Otherwise follow up in 1 week.   Kidney stones - Plan: PSA  Flank pain - Plan: POCT urinalysis dipstick, ketorolac (TORADOL) injection 60 mg, tamsulosin  (FLOMAX) 0.4 MG CAPS capsule, oxyCODONE-acetaminophen (ROXICET) 5-325 MG tablet, PSA  Need for influenza vaccination - Plan: Flu Vaccine QUAD 36+ mos IM, tamsulosin (FLOMAX) 0.4 MG CAPS capsule, oxyCODONE-acetaminophen (ROXICET) 5-325 MG tablet  Right lower quadrant abdominal pain - Plan: CT ABDOMEN PELVIS WO CONTRAST, tamsulosin (FLOMAX) 0.4 MG CAPS capsule, oxyCODONE-acetaminophen (ROXICET) 5-325 MG tablet  Hypertrophy of prostate - Plan: PSA  Trena Platt, PA-C [facility] 9/2/20187:33 AM

## 2017-08-14 LAB — PSA: Prostate Specific Ag, Serum: 0.7 ng/mL (ref 0.0–4.0)

## 2017-08-22 ENCOUNTER — Encounter: Payer: Self-pay | Admitting: Physician Assistant

## 2017-08-22 ENCOUNTER — Ambulatory Visit (INDEPENDENT_AMBULATORY_CARE_PROVIDER_SITE_OTHER): Payer: 59 | Admitting: Physician Assistant

## 2017-08-22 VITALS — BP 137/89 | HR 76 | Temp 98.4°F | Resp 18 | Ht 65.75 in | Wt 199.2 lb

## 2017-08-22 DIAGNOSIS — R311 Benign essential microscopic hematuria: Secondary | ICD-10-CM

## 2017-08-22 DIAGNOSIS — N200 Calculus of kidney: Secondary | ICD-10-CM | POA: Diagnosis not present

## 2017-08-22 LAB — POCT URINALYSIS DIP (MANUAL ENTRY)
Bilirubin, UA: NEGATIVE
GLUCOSE UA: NEGATIVE mg/dL
Ketones, POC UA: NEGATIVE mg/dL
LEUKOCYTES UA: NEGATIVE
NITRITE UA: NEGATIVE
Protein Ur, POC: NEGATIVE mg/dL
SPEC GRAV UA: 1.02 (ref 1.010–1.025)
UROBILINOGEN UA: 2 U/dL — AB
pH, UA: 6 (ref 5.0–8.0)

## 2017-08-22 LAB — BASIC METABOLIC PANEL
BUN/Creatinine Ratio: 10 (ref 9–20)
BUN: 12 mg/dL (ref 6–24)
CO2: 26 mmol/L (ref 20–29)
CREATININE: 1.23 mg/dL (ref 0.76–1.27)
Calcium: 9.6 mg/dL (ref 8.7–10.2)
Chloride: 101 mmol/L (ref 96–106)
GFR calc Af Amer: 78 mL/min/{1.73_m2} (ref >59)
GFR, EST NON AFRICAN AMERICAN: 68 mL/min/{1.73_m2} (ref >59)
Glucose: 79 mg/dL (ref 65–99)
Potassium: 4 mmol/L (ref 3.5–5.2)
SODIUM: 143 mmol/L (ref 134–144)

## 2017-08-22 NOTE — Progress Notes (Signed)
PRIMARY CARE AT [facility] [address] [place] [address] [phone]  Date:  08/22/2017   Name:  Logan Holt   DOB:  [birth year]   MRN:  [id number]  PCP:  Patient, No Pcp Per    History of Present Illness:  Logan Holt is a 51 y.o. male patient who presents to PCP with  Chief Complaint  Patient presents with  . Follow-up    follow up on kidneys     --patient is here for recheck.  Returning 7 days ago after flank pain revealed bilateral kidney stones.  He stopped taking the percocet Sunday.  This was the last of his pain.  He used filter, but found no stones.  No hematuria visualized.  Noticed stool loose.  He is taking the miralax.  He feels much better.  Needs restriction release to return back to work.  He noted he tried to return yesterday and had clouded thought, but feels much better now that the percocet is going away, he reports.    Patient Active Problem List   Diagnosis Date Noted  . Rotator cuff tear 04/21/2014  . OSA (obstructive sleep apnea) 01/22/2014  . PVC's (premature ventricular contractions) 01/22/2014  . PALPITATIONS 10/08/2009  . SARCOIDOSIS 09/15/2009    Past Medical History:  Diagnosis Date  . Blood transfusion without reported diagnosis   . Depression   . GERD (gastroesophageal reflux disease)    prn Nexium  . History of hypertension    was taken off med. after losing weight  . History of kidney stones   . OSA (obstructive sleep apnea)    was diagnosed when weight was 205; does not use CPAP  . Rotator cuff tear 03/2014   right  . Sarcoidosis     Past Surgical History:  Procedure Laterality Date  . BRONCHOSCOPY    . KNEE ARTHROSCOPY Left   . ORIF FEMUR FRACTURE Left   . SHOULDER ARTHROSCOPY WITH ROTATOR CUFF REPAIR AND SUBACROMIAL DECOMPRESSION Right 04/21/2014   Procedure: RIGHT SHOULDER ARTHROSCOPY SUBACROMIAL DECOMPRESSION/DISTAL CLAVICLE RESECTION; OPEN BICEPS TENODESIS AND ROTATOR CUFF REPAIR ;  Surgeon: Wyn Forster., MD;  Location:  [facility];  Service: Orthopedics;  Laterality: Right;    Social History  Substance Use Topics  . Smoking status: Never Smoker  . Smokeless tobacco: Never Used  . Alcohol use No    History reviewed. No pertinent family history.  No Known Allergies  Medication list has been reviewed and updated.  Current Outpatient Prescriptions on File Prior to Visit  Medication Sig Dispense Refill  . tamsulosin (FLOMAX) 0.4 MG CAPS capsule Take 1 capsule (0.4 mg total) by mouth daily. 30 capsule 3  . oxyCODONE-acetaminophen (ROXICET) 5-325 MG tablet Take 1 tablet by mouth every 6 (six) hours as needed for severe pain. (Patient not taking: Reported on 08/22/2017) 20 tablet 0   No current facility-administered medications on file prior to visit.     ROS ROS otherwise unremarkable unless listed above.  Physical Examination: BP 137/89 (BP Location: Right Arm, Patient Position: Sitting, Cuff Size: Large)   Pulse 76   Temp 98.4 F (36.9 C) (Oral)   Resp 18   Ht 5' 5.75" (1.67 m)   Wt 199 lb 3.2 oz (90.4 kg)   SpO2 98%   BMI 32.40 kg/m  Ideal Body Weight: Weight in (lb) to have BMI = 25: 153.4  Physical Exam  Constitutional: He is oriented to person, place, and time. He appears well-developed and  well-nourished. No distress.  HENT:  Head: Normocephalic and atraumatic.  Eyes: Pupils are equal, round, and reactive to light. Conjunctivae and EOM are normal.  Cardiovascular: Normal rate.   Pulmonary/Chest: Effort normal. No respiratory distress.  Abdominal: Soft. Normal appearance and bowel sounds are normal. There is no tenderness.  Neurological: He is alert and oriented to person, place, and time.  Skin: Skin is warm and dry. He is not diaphoretic.  Psychiatric: He has a normal mood and affect. His behavior is normal.     Assessment and Plan: Logan Holt is a 51 y.o. male who is here today for recheck of kidney stones. Likely passed. Will follow up as needed.  Advised to  return to clinic for physical exam. Nephrolithiasis - Plan: Basic metabolic panel, POCT urinalysis dipstick  Benign essential microscopic hematuria - Plan: Basic metabolic panel  Logan PlattStephanie Lejuan Botto, PA-C [facility] 9/5/20189:23 AM

## 2017-08-22 NOTE — Patient Instructions (Addendum)
Looks like this is passing stone.  Please return if your symptoms are not improving. I am so happy that you are doing better!!    IF you received an x-ray today, you will receive an invoice from [facility]. Please contact [facility] at [phone] with questions or concerns regarding your invoice.   IF you received labwork today, you will receive an invoice from [facility]. Please contact [facility] at [phone] with questions or concerns regarding your invoice.   Our billing staff will not be able to assist you with questions regarding bills from these companies.  You will be contacted with the lab results as soon as they are available. The fastest way to get your results is to activate your My Chart account. Instructions are located on the last page of this paperwork. If you have not heard from us regarding the results in 2 weeks, please contact this office.

## 2017-11-13 ENCOUNTER — Ambulatory Visit: Payer: 59 | Admitting: Internal Medicine

## 2017-11-13 ENCOUNTER — Encounter: Payer: Self-pay | Admitting: Internal Medicine

## 2017-11-13 ENCOUNTER — Other Ambulatory Visit (INDEPENDENT_AMBULATORY_CARE_PROVIDER_SITE_OTHER): Payer: 59

## 2017-11-13 ENCOUNTER — Ambulatory Visit (INDEPENDENT_AMBULATORY_CARE_PROVIDER_SITE_OTHER)
Admission: RE | Admit: 2017-11-13 | Discharge: 2017-11-13 | Disposition: A | Discharge status: Home / Self Care | Payer: 59 | Source: Ambulatory Visit | Attending: Internal Medicine | Admitting: Internal Medicine

## 2017-11-13 VITALS — BP 124/84 | HR 77 | Ht 65.0 in | Wt 202.0 lb

## 2017-11-13 DIAGNOSIS — R0609 Other forms of dyspnea: Secondary | ICD-10-CM

## 2017-11-13 DIAGNOSIS — D869 Sarcoidosis, unspecified: Secondary | ICD-10-CM

## 2017-11-13 LAB — CBC WITH DIFFERENTIAL/PLATELET
BASOS ABS: 0 10*3/uL (ref 0.0–0.1)
Basophils Relative: 0.8 % (ref 0.0–3.0)
EOS ABS: 0.2 10*3/uL (ref 0.0–0.7)
Eosinophils Relative: 3.3 % (ref 0.0–5.0)
HEMATOCRIT: 46.1 % (ref 39.0–52.0)
HEMOGLOBIN: 15.4 g/dL (ref 13.0–17.0)
LYMPHS PCT: 24.4 % (ref 12.0–46.0)
Lymphs Abs: 1.2 10*3/uL (ref 0.7–4.0)
MCHC: 33.4 g/dL (ref 30.0–36.0)
MCV: 83.1 fl (ref 78.0–100.0)
Monocytes Absolute: 0.6 10*3/uL (ref 0.1–1.0)
Monocytes Relative: 13.1 % — ABNORMAL HIGH (ref 3.0–12.0)
Neutro Abs: 2.9 10*3/uL (ref 1.4–7.7)
Neutrophils Relative %: 58.4 % (ref 43.0–77.0)
Platelets: 247 10*3/uL (ref 150.0–400.0)
RBC: 5.55 Mil/uL (ref 4.22–5.81)
RDW: 14 % (ref 11.5–15.5)
WBC: 4.9 10*3/uL (ref 4.0–10.5)

## 2017-11-13 LAB — BASIC METABOLIC PANEL
BUN: 13 mg/dL (ref 6–23)
CO2: 31 meq/L (ref 19–32)
Calcium: 10.1 mg/dL (ref 8.4–10.5)
Chloride: 100 mEq/L (ref 96–112)
Creatinine, Ser: 1.16 mg/dL (ref 0.40–1.50)
GFR: 85.25 mL/min (ref >60.00)
GLUCOSE: 97 mg/dL (ref 70–99)
POTASSIUM: 3.7 meq/L (ref 3.5–5.1)
Sodium: 140 mEq/L (ref 135–145)

## 2017-11-13 LAB — BRAIN NATRIURETIC PEPTIDE: Pro B Natriuretic peptide (BNP): 4 pg/mL (ref 0.0–100.0)

## 2017-11-13 LAB — TSH: TSH: 0.98 u[IU]/mL (ref 0.35–4.50)

## 2017-11-13 LAB — SEDIMENTATION RATE: SED RATE: 11 mm/h (ref 0–20)

## 2017-11-13 MED ORDER — PREDNISONE 10 MG PO TABS: ORAL_TABLET | ORAL | 0 refills | Status: DC

## 2017-11-13 MED ORDER — AMOXICILLIN-POT CLAVULANATE 875-125 MG PO TABS: 1.0000 | ORAL_TABLET | Freq: Two times a day (BID) | ORAL | 0 refills | Status: AC

## 2017-11-13 MED ORDER — FAMOTIDINE 20 MG PO TABS: ORAL_TABLET | ORAL | 2 refills | Status: DC

## 2017-11-13 MED ORDER — PANTOPRAZOLE SODIUM 40 MG PO TBEC: 40.0000 mg | DELAYED_RELEASE_TABLET | Freq: Every day | ORAL | 2 refills | Status: DC

## 2017-11-13 NOTE — Patient Instructions (Addendum)
Augmentin 875 mg take one pill twice daily  X 10 days - take at breakfast and supper with large glass of water.  It would help reduce the usual side effects (diarrhea and yeast infections) if you ate cultured yogurt at lunch.   Prednisone 10 mg take  4 each am x 2 days,   2 each am x 2 days,  1 each am x 2 days and stop   Pantoprazole (protonix) 40 mg   Take  30-60 min before first meal of the day and Pepcid (famotidine)  20 mg one @  bedtime until return to office - this is the best way to tell whether stomach acid is contributing to your problem.   GERD (REFLUX)  is an extremely common cause of respiratory symptoms just like yours , many times with no obvious heartburn at all.    It can be treated with medication, but also with lifestyle changes including elevation of the head of your bed (ideally with 6 inch  bed blocks),  Smoking cessation, avoidance of late meals, excessive alcohol, and avoid fatty foods, chocolate, peppermint, colas, red wine, and acidic juices such as orange juice.  NO MINT OR MENTHOL PRODUCTS SO NO COUGH DROPS  USE SUGARLESS CANDY INSTEAD (Jolley ranchers or Stover's or Life Savers) or even ice chips will also do - the key is to swallow to prevent all throat clearing. NO OIL BASED VITAMINS - use powdered substitutes.     Please remember to go to the lab and x-ray department downstairs in the basement  for your tests - we will call you with the results when they are available.     Please schedule a follow up office visit in  2  weeks, sooner if needed

## 2017-11-13 NOTE — Progress Notes (Signed)
Subjective:     Patient ID: Logan Holt, male   DOB: Jan 12, 1966,    MRN: [id number]  HPI   [age] yobm never smoker with new cough to point of vomiting plus sob dx sarcoid 2004 by Jackey LogeSood req prednisone/ inhaler x sev months and none since  then but lingering sense of chest congestion year round assoc  Then assoc with nasal congestion started  In September 2018 with cough felt similar to sarcoid assoc sob so self referred to pulmonary clinic 11/13/2017    11/13/2017 1st [facility] office visit/ Leonette Tischer   Chief Complaint  Patient presents with  . Pulmonary Consult    Self referral. Last seen here in 2010. He c/o increased SOB and cough x 2 months. He was winded walking from lobby to exam room today. His cough is occ prod with grey sputum.    comfortable at rest/ and sleeping but some am mucus grey to green but rest of day mostly dry   Doe x 50 ft  Cough to point of vomiting/ onset was indolent x 2 m persistent daily/ gradually more severe.  Has not tried any meds, even otc's yet    No obvious day to day or daytime variability or assoc   mucus plugs or hemoptysis or cp or chest tightness, subjective wheeze or overt sinus or hb symptoms. No unusual exposure hx or h/o childhood pna/ asthma or knowledge of premature birth.  Sleeping ok flat without nocturnal  or early am exacerbation  of respiratory  c/o's or need for noct saba. Also denies any obvious fluctuation of symptoms with weather or environmental changes or other aggravating or alleviating factors except as outlined above   Current Allergies, Complete Past Medical History, Past Surgical History, Family History, and Social History were reviewed in [name] electronic medical record.  ROS  The following are not active complaints unless bolded Hoarseness, sore throat, dysphagia, dental problems, itching, sneezing,  nasal congestion or discharge of excess mucus or purulent secretions, ear ache,   fever, chills, sweats, unintended wt loss  or wt gain, classically pleuritic or exertional cp,  orthopnea pnd or leg swelling, presyncope, palpitations, abdominal pain, anorexia, nausea, vomiting, diarrhea  or change in bowel habits or change in bladder habits, change in stools or change in urine, dysuria, hematuria,  rash, arthralgias, visual complaints, headache, numbness, weakness or ataxia or problems with walking or coordination,  change in mood/affect or memory.        No outpatient medications have been marked as taking for the 11/13/17 encounter (Office Visit) with Nyoka CowdenWert, Martena Emanuele B, MD.             Review of Systems     Objective:   Physical Exam  amb bm nad    Wt Readings from Last 3 Encounters:  11/13/17 202 lb (91.6 kg)  08/22/17 199 lb 3.2 oz (90.4 kg)  08/13/17 201 lb (91.2 kg)    Vital signs reviewed - Note on arrival 02 sats  100% on RA      HEENT: nl dentition, , and oropharynx. Nl external ear canals without cough reflex - moderate bilateral non-specific turbinate edema  With purulent secretions on R and crusting on L    NECK :  without JVD/Nodes/TM/ nl carotid upstrokes bilaterally   LUNGS: no acc muscle use,  Nl contour chest which is clear to A and P bilaterally without cough on insp or exp maneuvers   CV:  RRR  no s3 or murmur or  increase in P2, and no edema   ABD:  soft and nontender with nl inspiratory excursion in the supine position. No bruits or organomegaly appreciated, bowel sounds nl  MS:  Nl gait/ ext warm without deformities, calf tenderness, cyanosis or clubbing No obvious joint restrictions   SKIN: warm and dry without lesions    NEURO:  alert, approp, nl sensorium with  no motor or cerebellar deficits apparent.     CXR PA and Lateral:   11/13/2017 :    I personally reviewed images and agree with radiology impression as follows:   1. Interval resolution of bilateral hilar fullness consistent with resolution of hilar adenopathy.  2. Mild basilar interstitial prominence  noted bilaterally    Labs ordered/ reviewed:      Chemistry      Component Value Date/Time   NA 140 11/13/2017 1056   NA 143 08/22/2017 0926   K 3.7 11/13/2017 1056   CL 100 11/13/2017 1056   CO2 31 11/13/2017 1056   BUN 13 11/13/2017 1056   BUN 12 08/22/2017 0926   CREATININE 1.16 11/13/2017 1056      Component Value Date/Time   CALCIUM 10.1 11/13/2017 1056                             Lab Results  Component Value Date   WBC 4.9 11/13/2017   HGB 15.4 11/13/2017   HCT 46.1 11/13/2017   MCV 83.1 11/13/2017   PLT 247.0 11/13/2017         Lab Results  Component Value Date   TSH 0.98 11/13/2017     Lab Results  Component Value Date   PROBNP 4.0 11/13/2017       Lab Results  Component Value Date   ESRSEDRATE 11 11/13/2017   ESRSEDRATE 10 09/15/2009        Labs ordered 11/13/2017   Angiotensin level          Assessment:

## 2017-11-14 ENCOUNTER — Encounter: Payer: Self-pay | Admitting: Internal Medicine

## 2017-11-14 LAB — RESPIRATORY ALLERGY PROFILE REGION II ~~LOC~~
ALLERGEN, D PTERNOYSSINUS, D1: 5.59 kU/L — AB
Allergen, A. alternata, m6: 0.81 kU/L — ABNORMAL HIGH
Allergen, Cedar tree, t12: <0.1 kU/L
Allergen, Comm Silver Birch, t9: <0.1 kU/L
Allergen, Cottonwood, t14: 0.11 kU/L — ABNORMAL HIGH
Allergen, Mulberry, t76: <0.1 kU/L
Allergen, P. notatum, m1: <0.1 kU/L
Aspergillus fumigatus, m3: <0.1 kU/L
Box Elder IgE: <0.1 kU/L
CLADOSPORIUM HERBARUM (M2) IGE: <0.1 kU/L
CLASS: 0
CLASS: 0
CLASS: 0
CLASS: 0
CLASS: 0
CLASS: 0
CLASS: 0
CLASS: 0
CLASS: 0
CLASS: 1
COCKROACH: 0.4 kU/L — AB
COMMON RAGWEED (SHORT) (W1) IGE: 1.52 kU/L — ABNORMAL HIGH
Cat Dander: <0.1 kU/L
Class: 0
Class: 0
Class: 0
Class: 0
Class: 0
Class: 0
Class: 0
Class: 0
Class: 0
Class: 0
Class: 2
Class: 2
Class: 3
Class: 3
D. farinae: 6.07 kU/L — ABNORMAL HIGH
Dog Dander: <0.1 kU/L
Elm IgE: <0.1 kU/L
IgE (Immunoglobulin E), Serum: 44 kU/L (ref <=114)
Johnson Grass: <0.1 kU/L
Pecan/Hickory Tree IgE: 0.14 kU/L — ABNORMAL HIGH
Rough Pigweed  IgE: <0.1 kU/L

## 2017-11-14 LAB — ANGIOTENSIN CONVERTING ENZYME: ANGIOTENSIN-CONVERTING ENZYME: 65 U/L (ref 9–67)

## 2017-11-14 LAB — INTERPRETATION:

## 2017-11-14 NOTE — Assessment & Plan Note (Addendum)
11/13/2017  Walked RA x 3 laps @ 185 ft each stopped due to  End of study, fast pace, no sob or desat - Spirometry 11/13/2017   wnl   Symptoms are markedly disproportionate to objective findings and not clear this is actually much of a  lung problem but pt does appear to have difficult to sort out respiratory symptoms of unknown origin for which  DDX  = almost all start with A and  include Adherence, Ace Inhibitors, Acid Reflux, Active Sinus Disease, Alpha 1 Antitripsin deficiency, Anxiety masquerading as Airways dz,  ABPA,  Allergy(esp in young), Aspiration (esp in elderly), Adverse effects of meds,  Active smokers, A bunch of PE's (a small clot burden can't cause this syndrome unless there is already severe underlying pulm or vascular dz with poor reserve) plus two Bs  = Bronchiectasis and Beta blocker use..and one C= CHF     Adherence is always the initial "prime suspect" and is a multilayered concern that requires a "trust but verify" approach in every patient - starting with knowing how to use medications, especially inhalers, correctly, keeping up with refills and understanding the fundamental difference between maintenance and prns vs those medications only taken for a very short course and then stopped and not refilled.  - return with all meds in hand using a trust but verify approach to confirm accurate Medication  Reconciliation The principal here is that until we are certain that the  patients are doing what we've asked, it makes no sense to ask them to do more.   ? Acid (or non-acid) GERD > always difficult to exclude as up to 75% of pts in some series report no assoc GI/ Heartburn symptoms and clearly refluxing as coughs to point of vomiting> rec max (24h)  acid suppression and diet restrictions/ reviewed and instructions given in writing.   ? Active sinus dz > Augmentin 875 mg take one pill twice daily  X 10 days and f/u sinus ct prn   ? Anxiety > usually at the bottom of this list of  usual suspects   ? Bronchiectasis > usually not a feature of sarcoid unless endstage, hold off HRCT for now   ? CHF >   BNP rules out in this setting with nothing else to suggest this dx   Discussed in detail all the  indications, usual  risks and alternatives  relative to the benefits with patient who agrees to proceed with w/u as outlined.     Total time devoted to counseling  > 50 % of initial 60 min office visit:  review case with pt/ discussion of options/alternatives/ personally creating written customized instructions  in presence of pt  then going over those specific  Instructions directly with the pt including how to use all of the meds but in particular covering each new medication in detail and the difference between the maintenance= "automatic" meds and the prns using an action plan format for the latter (If this problem/symptom => do that organization reading Left to right).  Please see AVS from this visit for a full list of these instructions which I personally wrote for this pt and  are unique to this visit.

## 2017-11-14 NOTE — Assessment & Plan Note (Signed)
A good rule of thumb is that >95% of pts with active sarcoid in any organ will have some plain cxr changes - on the other hand  if there are active pulmonary symptoms the cxr will look much worse than the patient:  No evidence of either scenario here/ strongly doubt active dz    Will check angiotensin level but no further w/u for sarcoid for now

## 2017-11-15 NOTE — Progress Notes (Signed)
LMTCB

## 2017-11-27 ENCOUNTER — Ambulatory Visit: Payer: 59 | Admitting: Internal Medicine

## 2017-12-03 ENCOUNTER — Encounter: Payer: Self-pay | Admitting: Internal Medicine

## 2017-12-03 ENCOUNTER — Ambulatory Visit: Payer: 59 | Admitting: Internal Medicine

## 2017-12-03 VITALS — BP 136/86 | HR 91 | Ht 65.0 in | Wt 204.0 lb

## 2017-12-03 DIAGNOSIS — R0609 Other forms of dyspnea: Secondary | ICD-10-CM

## 2017-12-03 DIAGNOSIS — R05 Cough: Secondary | ICD-10-CM | POA: Diagnosis not present

## 2017-12-03 DIAGNOSIS — D869 Sarcoidosis, unspecified: Secondary | ICD-10-CM

## 2017-12-03 DIAGNOSIS — R058 Other specified cough: Secondary | ICD-10-CM

## 2017-12-03 NOTE — Progress Notes (Signed)
Subjective:     Patient ID: Logan Holt, male   DOB: 09/28/1966,    MRN: [id number]    Brief patient profile:  [age] yobm never smoker with new cough to point of vomiting plus sob dx sarcoid 2004 by Logan Holt req prednisone/ inhaler x sev months and none since  then but lingering sense of chest congestion year round assoc  Then assoc with nasal congestion started  In September 2018 with cough felt similar to sarcoid assoc sob so self referred to [facility] 11/13/2017     History of Present Illness  11/13/2017 1st Logan [facility] office visit/ Logan Holt   Chief Complaint  Patient presents with  . [facility] Consult    Self referral. Last seen here in 2010. He c/o increased SOB and cough x 2 months. He was winded walking from lobby to exam room today. His cough is occ prod with grey sputum.    comfortable at rest/ and sleeping but some am mucus grey to green but rest of day mostly dry   Doe x 50 ft  Cough to point of vomiting/ onset was indolent x 2 m persistent daily/ gradually more severe.  Has not tried any meds, even otc's yet  rec Augmentin 875 mg take one pill twice daily  X 10 days - take at breakfast and supper with large glass of water.  It would help reduce the usual side effects (diarrhea and yeast infections) if you ate cultured yogurt at lunch.  Prednisone 10 mg take  4 each am x 2 days,   2 each am x 2 days,  1 each am x 2 days and stop  Pantoprazole (protonix) 40 mg   Take  30-60 min before first meal of the day and Pepcid (famotidine)  20 mg one @  bedtime until return to office - this is the best way to tell whether stomach acid is contributing to your problem.  GERD  Diet  Please schedule a follow up office visit in  2  weeks, sooner if needed     12/03/2017  f/u ov/Logan Holt re: chronic cough 90% resolve, doe resolved  Chief Complaint  Patient presents with  . Follow-up    Cough has improved and he is less SOB.    only sob now when over does it with exetion, admits he's very  inactive at baseline  No obvious day to day or daytime variability or assoc excess/ purulent sputum or mucus plugs or hemoptysis or cp or chest tightness, subjective wheeze or overt sinus or hb symptoms. No unusual exposure hx or h/o childhood pna/ asthma or knowledge of premature birth.  Sleeping ok flat without nocturnal  or early am exacerbation  of respiratory  c/o's or need for noct saba. Also denies any obvious fluctuation of symptoms with weather or environmental changes or other aggravating or alleviating factors except as outlined above   Current Allergies, Complete Past Medical History, Past Surgical History, Family History, and Social History were reviewed in [facility] electronic medical record.  ROS  The following are not active complaints unless bolded Hoarseness, sore throat, dysphagia, dental problems, itching, sneezing,  nasal congestion- improved or discharge of excess mucus or purulent secretions, ear ache,   fever, chills, sweats, unintended wt loss or wt gain, classically pleuritic or exertional cp,  orthopnea pnd or leg swelling, presyncope, palpitations, abdominal pain, anorexia, nausea, vomiting, diarrhea  or change in bowel habits or change in bladder habits, change in stools or change in urine, dysuria,  hematuria,  rash, arthralgias, visual complaints, headache, numbness, weakness or ataxia or problems with walking or coordination,  change in mood/affect or memory.        Current Meds  Medication Sig  . famotidine (PEPCID) 20 MG tablet One at bedtime  . pantoprazole (PROTONIX) 40 MG tablet Take 1 tablet (40 mg total) by mouth daily. Take 30-60 min before first meal of the day           Objective:   Physical Exam    amb bm nad   12/03/2017     204   11/13/17 202 lb (91.6 kg)  08/22/17 199 lb 3.2 oz (90.4 kg)  08/13/17 201 lb (91.2 kg)    Vital signs reviewed - Note on arrival 02 sats  99% on RA     HEENT: nl dentition, turbinates bilaterally, and  oropharynx. Nl external ear canals without cough reflex   NECK :  without JVD/Nodes/TM/ nl carotid upstrokes bilaterally   LUNGS: no acc muscle use,  Nl contour chest which is clear to A and P bilaterally without cough on insp or exp maneuvers   CV:  RRR  no s3 or murmur or increase in P2, and no edema   ABD:  soft and nontender with nl inspiratory excursion in the supine position. No bruits or organomegaly appreciated, bowel sounds nl  MS:  Nl gait/ ext warm without deformities, calf tenderness, cyanosis or clubbing No obvious joint restrictions   SKIN: warm and dry without lesions    NEURO:  alert, approp, nl sensorium with  no motor or cerebellar deficits apparent.                 Assessment:

## 2017-12-03 NOTE — Assessment & Plan Note (Signed)
11/13/2017  Walked RA x 3 laps @ 185 ft each stopped due to  End of study, fast pace, no sob or desat - Spirometry 11/13/2017   wnl     - Allergy profile  11/13/17 >  Eos 0.2 /  IgE  44 RAST pos Ragweed/ mold   Chronic symptoms of doe/ nasal congestion and cough all resolved after short term rx with pred and augmentin and maint rx for gerd  As he was coughing to point of vomiting clearly has component of gerd so rec maint on gerd rx x 2 months, work on wt loss/ conditioning/ and f/u to consider wean acid suppression p 3 full months of rx   I had an extended discussion with the patient/ wife reviewing all relevant studies completed to date and  lasting 15 to 20 minutes of a 25 minute visit    Each maintenance medication was reviewed in detail including most importantly the difference between maintenance and prns and under what circumstances the prns are to be triggered using an action plan format that is not reflected in the computer generated alphabetically organized AVS.    Please see AVS for specific instructions unique to this visit that I personally wrote and verbalized to the the pt in detail and then reviewed with pt  by my nurse highlighting any  changes in therapy recommended at today's visit to their plan of care.

## 2017-12-03 NOTE — Patient Instructions (Addendum)
No change in medications until return and remember the reflux diet!  To get the most out of exercise, you need to be continuously aware that you are short of breath, but never out of breath, for 30 minutes daily. As you improve, it will actually be easier for you to do the same amount of exercise  in  30 minutes so always push to the level where you are short of breath.      Please schedule a follow up visit in 2 months but call sooner if needed

## 2017-12-03 NOTE — Assessment & Plan Note (Signed)
rx 11/13/17 with pred x 6 days/ augmentin x 10 and maint rx with steroids > 90% better   See doe comments, no change in rx/ advised:  Of the three most common causes of  Sub-acute or recurrent or chronic cough, only one (GERD)  can actually contribute to/ trigger  the other two (asthma and post nasal drip syndrome)  and perpetuate the cylce of cough.  While not intuitively obvious, many patients with chronic low grade reflux do not cough until there is a primary insult that disturbs the protective epithelial barrier and exposes sensitive nerve endings.   This is typically viral but can be related to sinus dz which may have been the case here  but can be direct physical injury such as with an endotracheal tube.   The point is that once this occurs, it is difficult to eliminate the cycle  using anything but a maximally effective acid suppression regimen at least in the short run, accompanied by an appropriate diet to address non acid GERD.  Since so much better rec 2 more months rx then ov to determine best  long term option starting with taper and see if flares in which case low treshhold to eval for NF     .

## 2017-12-03 NOTE — Assessment & Plan Note (Signed)
No evidence at all to support active sarcoid, no further w/u indicated.

## 2018-02-04 ENCOUNTER — Encounter: Payer: Self-pay | Admitting: Internal Medicine

## 2018-02-04 ENCOUNTER — Ambulatory Visit: Payer: Managed Care, Other (non HMO) | Admitting: Internal Medicine

## 2018-02-04 VITALS — BP 142/88 | HR 86 | Ht 65.0 in | Wt 200.8 lb

## 2018-02-04 DIAGNOSIS — R05 Cough: Secondary | ICD-10-CM

## 2018-02-04 DIAGNOSIS — R058 Other specified cough: Secondary | ICD-10-CM

## 2018-02-04 DIAGNOSIS — R059 Cough, unspecified: Secondary | ICD-10-CM

## 2018-02-04 MED ORDER — FAMOTIDINE 20 MG PO TABS: ORAL_TABLET | ORAL | Status: DC

## 2018-02-04 NOTE — Assessment & Plan Note (Signed)
rx 11/13/17 with pred x 6 days/ augmentin x 10 days   - Allergy profile 02/04/2018 >  Eos 0. /  IgE  Pending  - Sinus Ct ordered  - 02/04/2018 add back flonase hs trial    Upper airway cough syndrome (previously labeled PNDS),  is so named because it's frequently impossible to sort out how much is  CR/sinusitis with freq throat clearing (which can be related to primary GERD)   vs  causing  secondary (" extra esophageal")  GERD from wide swings in gastric pressure that occur with throat clearing, often  promoting self use of mint and menthol lozenges that reduce the lower esophageal sphincter tone and exacerbate the problem further in a cyclical fashion.   These are the same pts (now being labeled as having "irritable larynx syndrome" by some cough centers) who not infrequently have a history of having failed to tolerate ace inhibitors,  dry powder inhalers or biphosphonates or report having atypical/extraesophageal reflux symptoms that don't respond to standard doses of PPI  and are easily confused as having aecopd or asthma flares by even experienced allergists/ pulmonologists (myself included).    rec try pepcid 20mg  bid if tolerates rather than ppi and rx nasal symptoms more aggressively to see what effect if any this has on the chronic cough    I had an extended discussion with the patient reviewing all relevant studies completed to date and  lasting 15 to 20 minutes of a 25 minute visit    Each maintenance medication was reviewed in detail including most importantly the difference between maintenance and prns and under what circumstances the prns are to be triggered using an action plan format that is not reflected in the computer generated alphabetically organized AVS.    Please see AVS for specific instructions unique to this visit that I personally wrote and verbalized to the the pt in detail and then reviewed with pt  by my nurse highlighting any  changes in therapy recommended at today's  visit to their plan of care.

## 2018-02-04 NOTE — Progress Notes (Signed)
Subjective:     Patient ID: Logan Holt, male   DOB: May 01, 1966,    MRN: [id number]    Brief patient profile:  31 yobm never smoker with new cough to point of vomiting plus sob dx sarcoid 2004 by Logan Holt prednisone/ inhaler x sev months and none since  then but lingering sense of chest congestion year round assoc  Then assoc with nasal congestion started  In September 2018 with cough felt similar to sarcoid assoc sob so self referred to pulmonary clinic 11/13/2017     History of Present Illness  11/13/2017 1st [facility] office visit/ Logan Holt   Chief Complaint  Patient presents with  . Pulmonary Consult    Self referral. Last seen here in 2010. He c/o increased SOB and cough x 2 months. He was winded walking from lobby to exam room today. His cough is occ prod with grey sputum.    comfortable at rest/ and sleeping but some am mucus grey to green but rest of day mostly dry   Doe x 50 ft  Cough to point of vomiting/ onset was indolent x 2 m persistent daily/ gradually more severe.  Has not tried any meds, even otc's yet  rec Augmentin 875 mg take one pill twice daily  X 10 days - take at breakfast and supper with large glass of water.  It would help reduce the usual side effects (diarrhea and yeast infections) if you ate cultured yogurt at lunch.  Prednisone 10 mg take  4 each am x 2 days,   2 each am x 2 days,  1 each am x 2 days and stop  Pantoprazole (protonix) 40 mg   Take  30-60 min before first meal of the day and Pepcid (famotidine)  20 mg one @  bedtime until return to office - this is the best way to tell whether stomach acid is contributing to your problem.  GERD  Diet  Please schedule a follow up office visit in  2  weeks, sooner if needed     12/03/2017  f/u ov/Logan Holt re: chronic cough 90% resolved, doe resolved  Chief Complaint  Patient presents with  . Follow-up    Cough has improved and he is less SOB.    only sob now when over does it with exetion, admits he's very  inactive at baseline rec No change in medications until return and remember the reflux diet! To get the most out of exercise, you need to be continuously aware that you are short of breath, but never out of breath, for 30 minutes daily. As you improve, it will actually be easier for you to do the same amount of exercise  in  30 minutes so always push to the level where you are short of breath.     02/04/2018  f/u ov/Logan Holt re: uacs  Chief Complaint  Patient presents with  . Follow-up    Cough has completely resolved. Breathing is doing well. He has occ nasal congestion.   Dyspnea:  Not limited by breathing from desired activities   Cough: only when  Nasal congestion daytime not noct  Sleep: well    Nasal symptoms x 2 years worse sept 2018   No obvious day to day or daytime variability or assoc excess/ purulent sputum or mucus plugs or hemoptysis or cp or chest tightness, subjective wheeze or overt   hb symptoms. No unusual exposure hx or h/o childhood pna/ asthma or knowledge of premature birth.  Sleeping  ok flat without nocturnal  or early am exacerbation  of respiratory  c/o's or need for noct saba. Also denies any obvious fluctuation of symptoms with weather or environmental changes or other aggravating or alleviating factors except as outlined above   Current Allergies, Complete Past Medical History, Past Surgical History, Family History, and Social History were reviewed in [name] electronic medical record.  ROS  The following are not active complaints unless bolded Hoarseness, sore throat, dysphagia, dental problems, itching, sneezing,  nasal congestion or discharge of excess mucus or purulent secretions, ear ache,   fever, chills, sweats, unintended wt loss or wt gain, classically pleuritic or exertional cp,  orthopnea pnd or leg swelling, presyncope, palpitations, abdominal pain, anorexia, nausea, vomiting, diarrhea  or change in bowel habits or change in bladder habits, change  in stools or change in urine, dysuria, hematuria,  rash, arthralgias, visual complaints, headache, numbness, weakness or ataxia or problems with walking or coordination,  change in mood/affect or memory.        Current Meds  Medication Sig  .     .   famotidine (PEPCID) 20 MG tablet One at bedtime  . [  pantoprazole (PROTONIX) 40 MG tablet Take 1 tablet (40 mg total) by mouth daily. Take 30-60 min before first meal of the day             Objective:   Physical Exam    amb bm nad   02/04/2018       200  12/03/2017     204   11/13/17 202 lb (91.6 kg)  08/22/17 199 lb 3.2 oz (90.4 kg)  08/13/17 201 lb (91.2 kg)      Vital signs reviewed - Note on arrival 02 sats  100% on RA           HEENT: nl dentition,  and oropharynx. Nl external ear canals without cough reflex - moderate bilateral non-specific turbinate edema  With mucoid/crusty secretions   NECK :  without JVD/Nodes/TM/ nl carotid upstrokes bilaterally   LUNGS: no acc muscle use,  Nl contour chest which is clear to A and P bilaterally without cough on insp or exp maneuvers   CV:  RRR  no s3 or murmur or increase in P2, and no edema   ABD:  soft and nontender with nl inspiratory excursion in the supine position. No bruits or organomegaly appreciated, bowel sounds nl  MS:  Nl gait/ ext warm without deformities, calf tenderness, cyanosis or clubbing No obvious joint restrictions   SKIN: warm and dry without lesions    NEURO:  alert, approp, nl sensorium with  no motor or cerebellar deficits apparent.             Labs ordered 02/04/2018  Allergy profile      Assessment:

## 2018-02-04 NOTE — Patient Instructions (Addendum)
Try to take pepcid 20 mg after breakfast and supper instead of using the pantoprazole and if bad heartburn returns let me refer you to our GI doctors for evaluation for long term needs  Add flonase one puff in each nostril at bedtime - point toward ear on same side   Please see patient coordinator before you leave today  to schedule sinus CT    Please remember to go to the lab department downstairs in the basement  for your tests - we will call you with the results when they are available.      Please schedule a follow up visit in 6  months but call sooner if needed

## 2018-02-11 ENCOUNTER — Telehealth: Payer: Self-pay | Admitting: Internal Medicine

## 2018-02-11 ENCOUNTER — Inpatient Hospital Stay: Admission: RE | Admit: 2018-02-11 | Payer: Managed Care, Other (non HMO) | Source: Ambulatory Visit

## 2018-02-11 MED ORDER — CEFDINIR 300 MG PO CAPS: 300.0000 mg | ORAL_CAPSULE | Freq: Two times a day (BID) | ORAL | 0 refills | Status: DC

## 2018-02-11 NOTE — Telephone Encounter (Signed)
Patient is aware, sent prescription in, nothing further needed at this time. He states that he will call back after he finishes the antibiotic.

## 2018-02-11 NOTE — Telephone Encounter (Signed)
Pt is calling back [phone]

## 2018-02-11 NOTE — Telephone Encounter (Signed)
MW ord ct sinus and ins will not cover the scan without him taking a long course of abx- per MW- let pt know and rx omnicef 300 mg bid x 28 days and then do the sinus ct if not 100% better.   LMTCB

## 2018-02-11 NOTE — Telephone Encounter (Signed)
Patient returned call, CB is [phone].

## 2018-02-11 NOTE — Telephone Encounter (Signed)
Attempted to call pt back but no answer. Left message for pt to return our call x1

## 2018-02-18 ENCOUNTER — Inpatient Hospital Stay: Admission: RE | Admit: 2018-02-18 | Payer: Managed Care, Other (non HMO) | Source: Ambulatory Visit

## 2018-03-20 ENCOUNTER — Encounter: Payer: Self-pay | Admitting: Physician Assistant

## 2018-08-05 ENCOUNTER — Ambulatory Visit: Payer: Managed Care, Other (non HMO) | Admitting: Internal Medicine

## 2018-11-25 IMAGING — DX DG CHEST 2V
2 series · 2 of 2 positions shown · non-contrast
Comparison: CT abdomen 08/17/2017.  Chest x-ray 09/15/2009.

CLINICAL DATA: Shortness of breath.  Cough.

EXAM:
CHEST  2 VIEW

[chest pa]
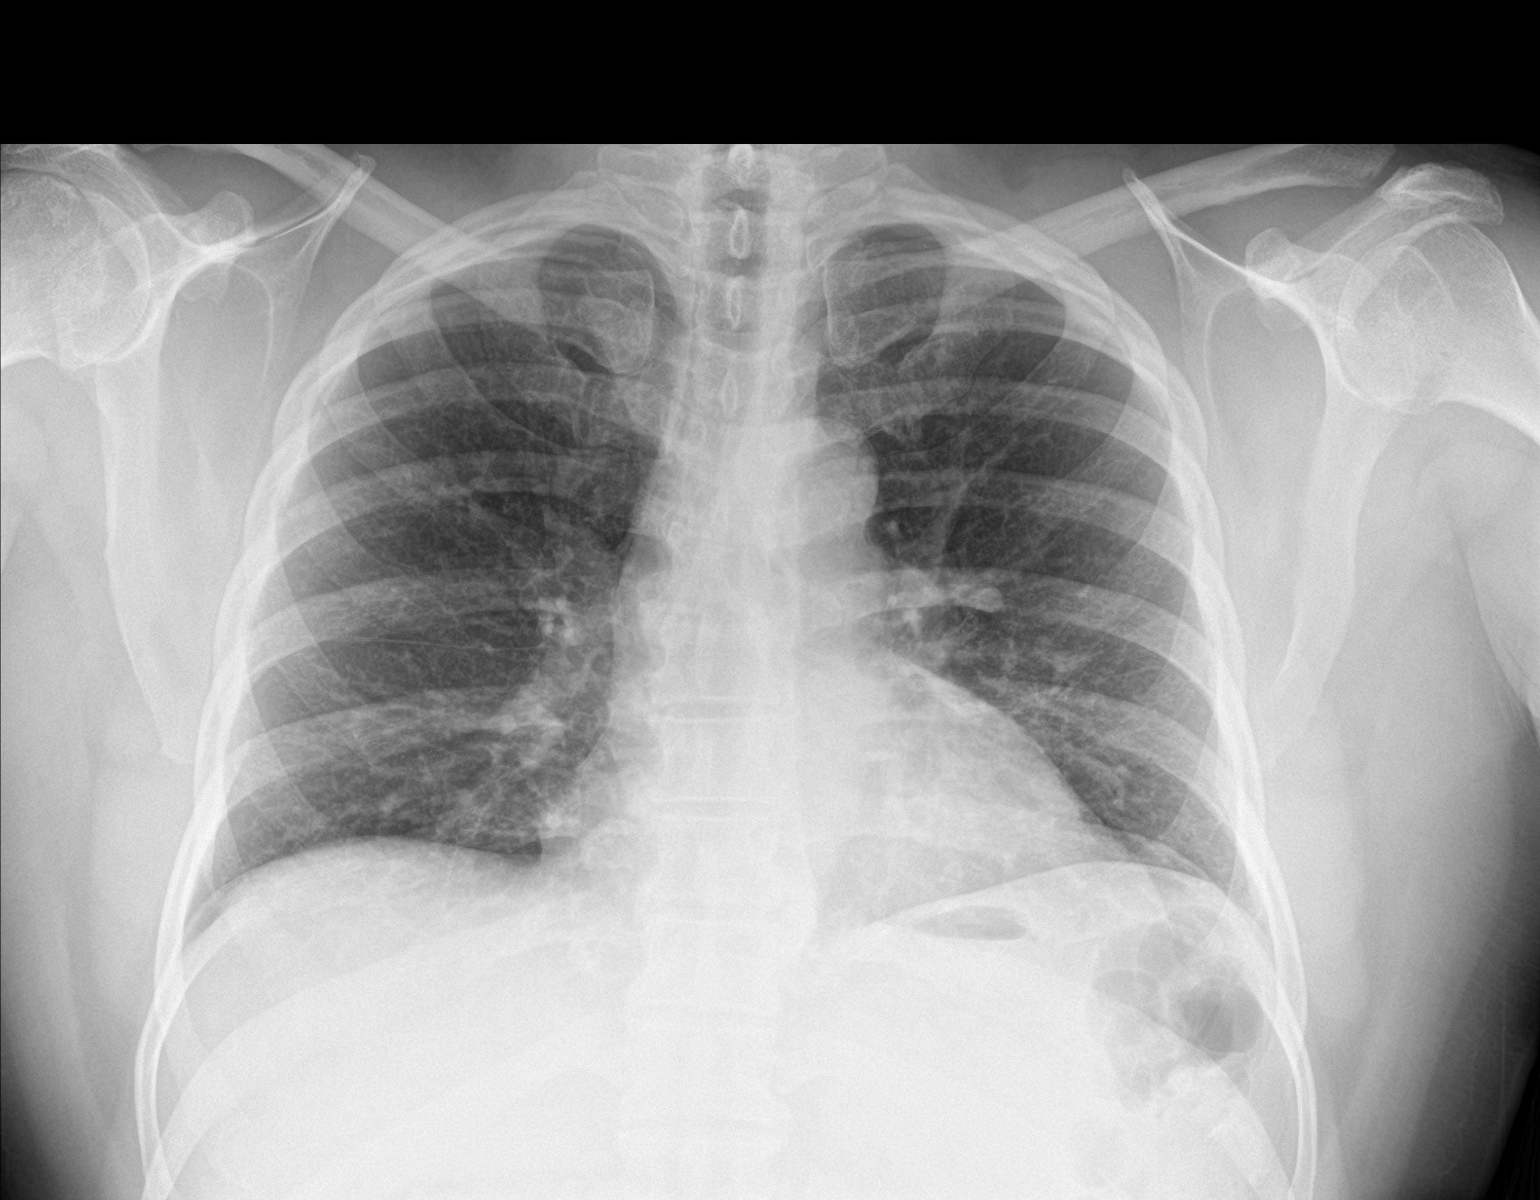

[chest lat]
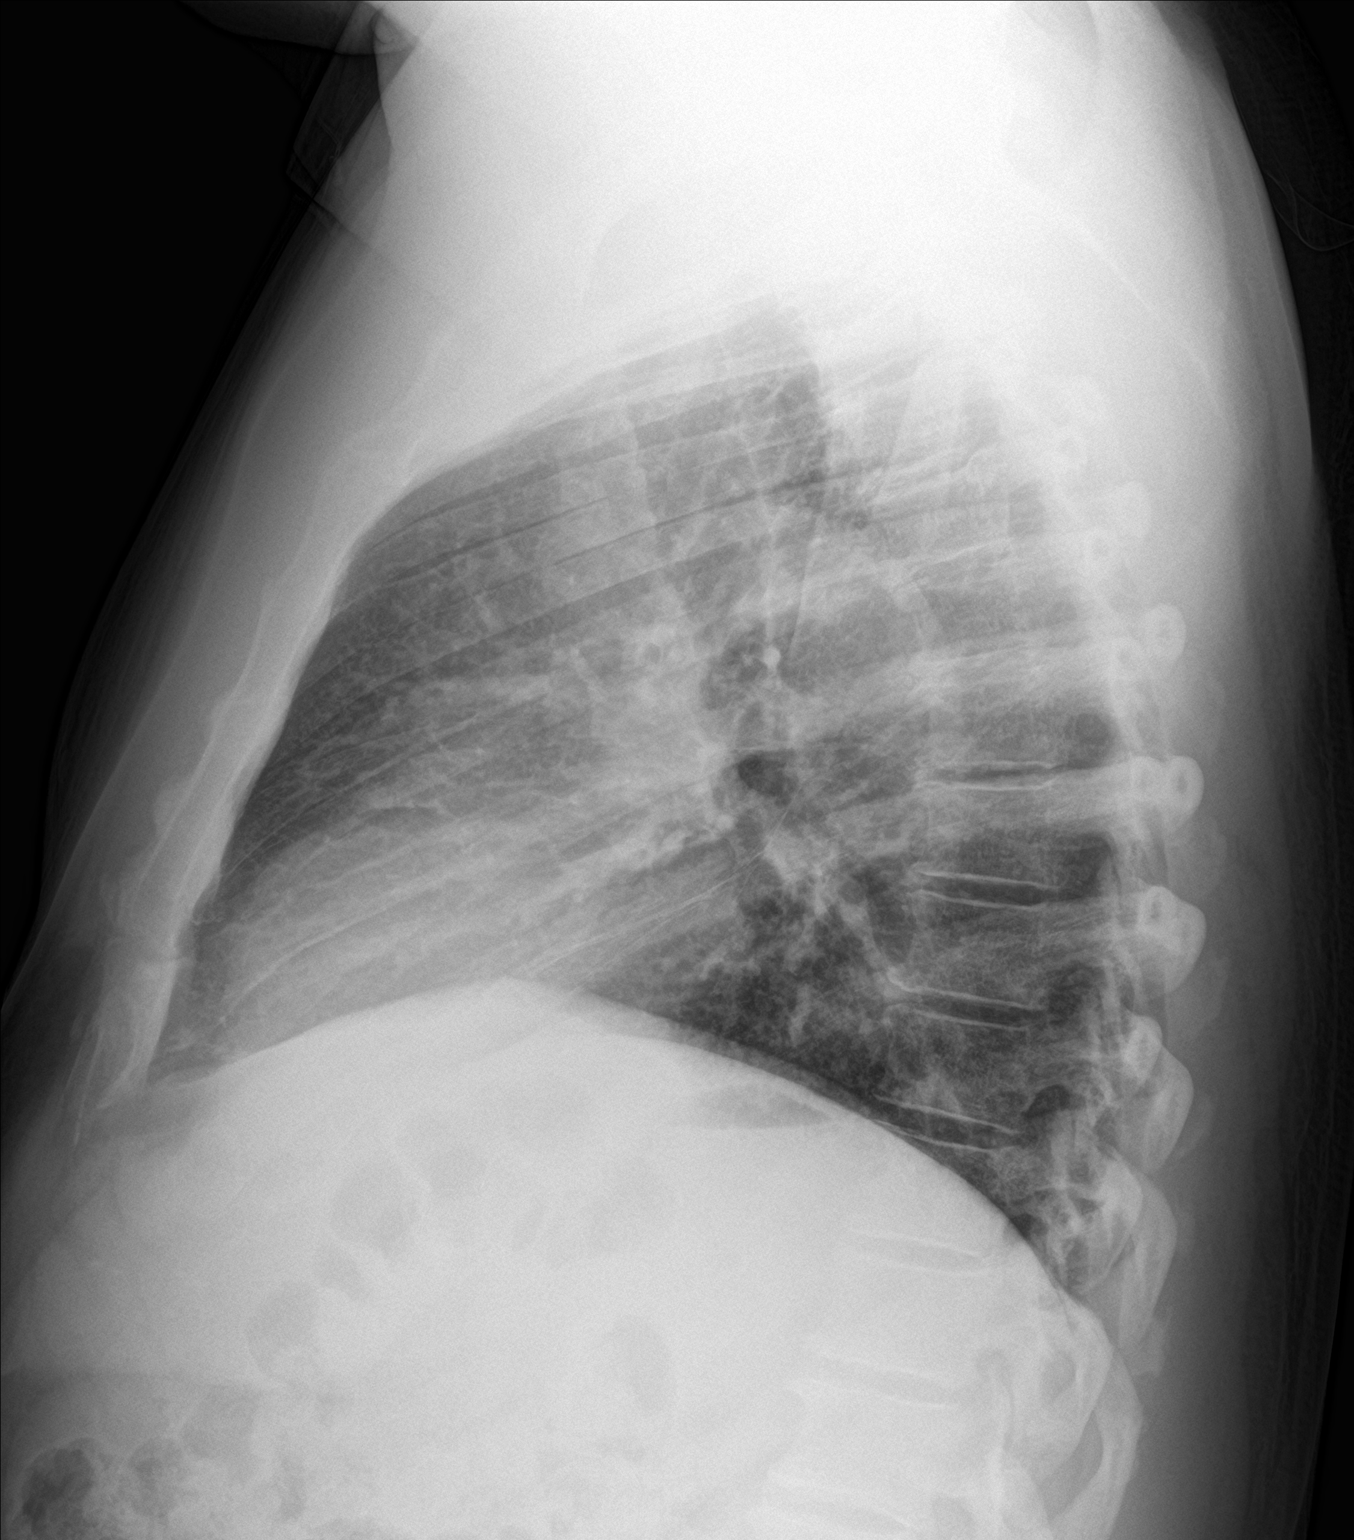

[2 of 2 positions shown; findings below may reference images not displayed]

FINDINGS: Mediastinum hilar structures normal. Previously identified bilateral
hilar fullness has resolved. This is consistent with resolution
hilar adenopathy. Mild basilar interstitial prominence noted
bilaterally. An active interstitial process including pneumonitis
cannot be excluded. Low lung volumes. No pleural effusion or
pneumothorax.
IMPRESSION: 1. Interval resolution of bilateral hilar fullness consistent with
resolution of hilar adenopathy.

2. Mild basilar interstitial prominence noted bilaterally. An active
interstitial process including pneumonitis cannot be excluded.

## 2019-02-18 ENCOUNTER — Other Ambulatory Visit: Payer: Self-pay | Admitting: Neurosurgery

## 2019-02-24 NOTE — Pre-Procedure Instructions (Signed)
Logan Holt  02/24/2019      [id number] - [place], [place] - [address] AT [facility] [address] [place] [address] Phone: [phone] Fax: [phone]    Your procedure is scheduled on Monday, March 16th.  Report to [facility] Admitting Entrance "A" at 12:30 P.M.  Call this number if you have problems the morning of surgery:  [phone]   Remember:  Do not eat or drink after midnight.    Take these medicines the morning of surgery with A SIP OF WATER  amLODipine (NORVASC)  famotidine (PEPCID) gabapentin (NEURONTIN)  HYDROcodone-acetaminophen (NORCO)-as needed  As of today, STOP taking any Aspirin (unless otherwise instructed by your surgeon), Aleve, Naproxen, Ibuprofen, Motrin, Advil, Goody's, BC's, all herbal medications, fish oil, and all vitamins.     Do not wear jewelry.  Do not wear lotions, powders, or colognes, or deodorant.  Men may shave face.  Do not bring valuables to the hospital.  [facility] is not responsible for any belongings or valuables.  Contacts, dentures or bridgework may not be worn into surgery.  Leave your suitcase in the car.  After surgery it may be brought to your room.  For patients admitted to the hospital, discharge time will be determined by your treatment team.  Patients discharged the day of surgery will not be allowed to drive home.   Special instructions:   [facility]- Preparing For Surgery  Before surgery, you can play an important role. Because skin is not sterile, your skin needs to be as free of germs as possible. You can reduce the number of germs on your skin by washing with CHG (chlorahexidine gluconate) Soap before surgery.  CHG is an antiseptic cleaner which kills germs and bonds with the skin to continue killing germs even after washing.    Oral Hygiene is also important to reduce your risk of infection.  Remember - BRUSH YOUR TEETH THE MORNING OF SURGERY WITH YOUR  REGULAR TOOTHPASTE  Please do not use if you have an allergy to CHG or antibacterial soaps. If your skin becomes reddened/irritated stop using the CHG.  Do not shave (including legs and underarms) for at least 48 hours prior to first CHG shower. It is OK to shave your face.  Please follow these instructions carefully.   1. Shower the NIGHT BEFORE SURGERY and the MORNING OF SURGERY with CHG.   2. If you chose to wash your hair, wash your hair first as usual with your normal shampoo.  3. After you shampoo, rinse your hair and body thoroughly to remove the shampoo.  4. Use CHG as you would any other liquid soap. You can apply CHG directly to the skin and wash gently with a scrungie or a clean washcloth.   5. Apply the CHG Soap to your body ONLY FROM THE NECK DOWN.  Do not use on open wounds or open sores. Avoid contact with your eyes, ears, mouth and genitals (private parts). Wash Face and genitals (private parts)  with your normal soap.  6. Wash thoroughly, paying special attention to the area where your surgery will be performed.  7. Thoroughly rinse your body with warm water from the neck down.  8. DO NOT shower/wash with your normal soap after using and rinsing off the CHG Soap.  9. Pat yourself dry with a CLEAN TOWEL.  10. Wear CLEAN PAJAMAS to bed the night before surgery, wear comfortable clothes  the morning of surgery  11. Place CLEAN SHEETS on your bed the night of your first shower and DO NOT SLEEP WITH PETS.    Day of Surgery:  Do not apply any deodorants/lotions.  Please wear clean clothes to the hospital/surgery center.   Remember to brush your teeth WITH YOUR REGULAR TOOTHPASTE.   Please read over the following fact sheets that you were given.

## 2019-02-25 ENCOUNTER — Encounter (HOSPITAL_COMMUNITY): Payer: Self-pay

## 2019-02-25 ENCOUNTER — Other Ambulatory Visit: Payer: Self-pay

## 2019-02-25 ENCOUNTER — Encounter (HOSPITAL_COMMUNITY)
Admission: RE | Admit: 2019-02-25 | Discharge: 2019-02-25 | Disposition: A | Discharge status: Home / Self Care | Payer: Managed Care, Other (non HMO) | Source: Ambulatory Visit | Attending: Neurosurgery | Admitting: Neurosurgery

## 2019-02-25 DIAGNOSIS — Z01818 Encounter for other preprocedural examination: Secondary | ICD-10-CM | POA: Diagnosis not present

## 2019-02-25 LAB — BASIC METABOLIC PANEL
Anion gap: 9 (ref 5–15)
BUN: 10 mg/dL (ref 6–20)
CO2: 24 mmol/L (ref 22–32)
Calcium: 9.8 mg/dL (ref 8.9–10.3)
Chloride: 104 mmol/L (ref 98–111)
Creatinine, Ser: 0.96 mg/dL (ref 0.61–1.24)
GFR calc Af Amer: >60 mL/min (ref >60)
GFR calc non Af Amer: >60 mL/min (ref >60)
Glucose, Bld: 87 mg/dL (ref 70–99)
Potassium: 3.9 mmol/L (ref 3.5–5.1)
SODIUM: 137 mmol/L (ref 135–145)

## 2019-02-25 LAB — CBC
HCT: 45.7 % (ref 39.0–52.0)
Hemoglobin: 15.5 g/dL (ref 13.0–17.0)
MCH: 27.7 pg (ref 26.0–34.0)
MCHC: 33.9 g/dL (ref 30.0–36.0)
MCV: 81.8 fL (ref 80.0–100.0)
NRBC: 0 % (ref 0.0–0.2)
Platelets: 286 10*3/uL (ref 150–400)
RBC: 5.59 MIL/uL (ref 4.22–5.81)
RDW: 12.7 % (ref 11.5–15.5)
WBC: 4.7 10*3/uL (ref 4.0–10.5)

## 2019-02-25 LAB — SURGICAL PCR SCREEN
MRSA, PCR: NEGATIVE
Staphylococcus aureus: NEGATIVE

## 2019-02-25 NOTE — Anesthesia Preprocedure Evaluation (Addendum)
Anesthesia Evaluation  Patient identified by MRN, date of birth, ID band Patient awake    Reviewed: Allergy & Precautions, NPO status , Patient's Chart, lab work & pertinent test results  Airway Mallampati: I  TM Distance: >3 FB Neck ROM: Full    Dental   Pulmonary sleep apnea ,  H/O Sarcoidosis   Pulmonary exam normal        Cardiovascular hypertension, Pt. on medications Normal cardiovascular exam     Neuro/Psych Depression    GI/Hepatic GERD  Medicated and Controlled,  Endo/Other    Renal/GU      Musculoskeletal   Abdominal   Peds  Hematology   Anesthesia Other Findings   Reproductive/Obstetrics                            Anesthesia Physical Anesthesia Plan  ASA: II  Anesthesia Plan: General   Post-op Pain Management:    Induction:   PONV Risk Score and Plan: 2 and Ondansetron, Dexamethasone and Midazolam  Airway Management Planned: Oral ETT  Additional Equipment:   Intra-op Plan:   Post-operative Plan: Extubation in OR  Informed Consent: I have reviewed the patients History and Physical, chart, labs and discussed the procedure including the risks, benefits and alternatives for the proposed anesthesia with the patient or authorized representative who has indicated his/her understanding and acceptance.       Plan Discussed with: CRNA and Surgeon  Anesthesia Plan Comments: (History of sarcoidosis, sees pulmonary PRN. Walk test 11/13/2017 Walked RA x 3 laps @ 185 ft each stopped due to End of study, fast pace, no sob or desat. Spirometry 11/13/2017 wnl.)       Anesthesia Quick Evaluation

## 2019-02-25 NOTE — Progress Notes (Addendum)
PCP - Loyal Jacobson Cardiologist - denies  Chest x-ray - N/A EKG - 02/25/19 Stress Test - 10+ years ago by pulmonologist for Sarcoidosis; reports normal study ECHO - 10+ years ago, possibly done with stress test.  Cardiac Cath - denies  Had a check up visit about 5 years ago and he was stable.  Pt has not had any complications from Sarcoidosis since then. Does not currently see a pulmonologist.    Sleep Study - OSA +, denies CPAP use. States he used to use one but not currently.   Blood Thinner Instructions:N/A Aspirin Instructions:N/A  Anesthesia review: Yes, EKG review  Patient denies shortness of breath, fever, cough and chest pain at PAT appointment   Patient verbalized understanding of instructions that were given to them at the PAT appointment. Patient was also instructed that they will need to review over the PAT instructions again at home before surgery.

## 2019-03-03 ENCOUNTER — Ambulatory Visit (HOSPITAL_COMMUNITY): Payer: Managed Care, Other (non HMO)

## 2019-03-03 ENCOUNTER — Ambulatory Visit (HOSPITAL_COMMUNITY): Payer: Managed Care, Other (non HMO) | Admitting: Physician Assistant

## 2019-03-03 ENCOUNTER — Other Ambulatory Visit: Payer: Self-pay

## 2019-03-03 ENCOUNTER — Ambulatory Visit (HOSPITAL_COMMUNITY): Payer: Managed Care, Other (non HMO) | Admitting: Certified Registered"

## 2019-03-03 ENCOUNTER — Encounter (HOSPITAL_COMMUNITY): Payer: Self-pay

## 2019-03-03 ENCOUNTER — Encounter (HOSPITAL_COMMUNITY)
Admission: RE | Disposition: A | Discharge status: Home / Self Care | Payer: Self-pay | Source: Home / Self Care | Attending: Neurosurgery

## 2019-03-03 ENCOUNTER — Ambulatory Visit (HOSPITAL_COMMUNITY)
Admission: RE | Admit: 2019-03-03 | Discharge: 2019-03-04 | Disposition: A | Discharge status: Home / Self Care | Payer: Managed Care, Other (non HMO) | Attending: Neurosurgery | Admitting: Neurosurgery

## 2019-03-03 DIAGNOSIS — M5020 Other cervical disc displacement, unspecified cervical region: Secondary | ICD-10-CM | POA: Diagnosis present

## 2019-03-03 DIAGNOSIS — R531 Weakness: Secondary | ICD-10-CM | POA: Diagnosis present

## 2019-03-03 DIAGNOSIS — G4733 Obstructive sleep apnea (adult) (pediatric): Secondary | ICD-10-CM | POA: Insufficient documentation

## 2019-03-03 DIAGNOSIS — M4723 Other spondylosis with radiculopathy, cervicothoracic region: Secondary | ICD-10-CM | POA: Insufficient documentation

## 2019-03-03 DIAGNOSIS — Z79899 Other long term (current) drug therapy: Secondary | ICD-10-CM | POA: Insufficient documentation

## 2019-03-03 DIAGNOSIS — I10 Essential (primary) hypertension: Secondary | ICD-10-CM | POA: Diagnosis not present

## 2019-03-03 DIAGNOSIS — M5023 Other cervical disc displacement, cervicothoracic region: Secondary | ICD-10-CM | POA: Insufficient documentation

## 2019-03-03 DIAGNOSIS — K219 Gastro-esophageal reflux disease without esophagitis: Secondary | ICD-10-CM | POA: Diagnosis not present

## 2019-03-03 DIAGNOSIS — Z419 Encounter for procedure for purposes other than remedying health state, unspecified: Secondary | ICD-10-CM

## 2019-03-03 HISTORY — DX: Spinal stenosis, cervical region: M48.02

## 2019-03-03 HISTORY — PX: POSTERIOR CERVICAL LAMINECTOMY: SHX2248

## 2019-03-03 SURGERY — POSTERIOR CERVICAL LAMINECTOMY
Anesthesia: General | Site: Spine Thoracic | Laterality: Right

## 2019-03-03 MED ORDER — PANTOPRAZOLE SODIUM 40 MG PO TBEC
40.0000 mg | DELAYED_RELEASE_TABLET | Freq: Every day | ORAL | Status: DC
  Administered 2019-03-03: 40 mg via ORAL
  Filled 2019-03-03: qty 1

## 2019-03-03 MED ORDER — ACETAMINOPHEN 325 MG PO TABS
650.0000 mg | ORAL_TABLET | ORAL | Status: DC | PRN
  Administered 2019-03-04: 650 mg via ORAL
  Filled 2019-03-03: qty 2

## 2019-03-03 MED ORDER — MENTHOL 3 MG MT LOZG: 1.0000 | LOZENGE | OROMUCOSAL | Status: DC | PRN

## 2019-03-03 MED ORDER — CEFAZOLIN SODIUM-DEXTROSE 2-4 GM/100ML-% IV SOLN
2.0000 g | INTRAVENOUS | Status: AC
  Administered 2019-03-03: 2 g via INTRAVENOUS

## 2019-03-03 MED ORDER — LIDOCAINE 2% (20 MG/ML) 5 ML SYRINGE
INTRAMUSCULAR | Status: DC | PRN
  Administered 2019-03-03: 100 mg via INTRAVENOUS

## 2019-03-03 MED ORDER — AMLODIPINE BESYLATE 5 MG PO TABS: 5.0000 mg | ORAL_TABLET | Freq: Every day | ORAL | Status: DC

## 2019-03-03 MED ORDER — DEXAMETHASONE SODIUM PHOSPHATE 10 MG/ML IJ SOLN
INTRAMUSCULAR | Status: AC
  Filled 2019-03-03: qty 1

## 2019-03-03 MED ORDER — SODIUM CHLORIDE 0.9 % IV SOLN: 250.0000 mL | INTRAVENOUS | Status: DC

## 2019-03-03 MED ORDER — FENTANYL CITRATE (PF) 250 MCG/5ML IJ SOLN
INTRAMUSCULAR | Status: AC
  Filled 2019-03-03: qty 5

## 2019-03-03 MED ORDER — MIDAZOLAM HCL 2 MG/2ML IJ SOLN
INTRAMUSCULAR | Status: AC
  Filled 2019-03-03: qty 2

## 2019-03-03 MED ORDER — PHENYLEPHRINE 40 MCG/ML (10ML) SYRINGE FOR IV PUSH (FOR BLOOD PRESSURE SUPPORT)
PREFILLED_SYRINGE | INTRAVENOUS | Status: AC
  Filled 2019-03-03: qty 10

## 2019-03-03 MED ORDER — HYDROMORPHONE HCL 1 MG/ML IJ SOLN: 0.5000 mg | INTRAMUSCULAR | Status: DC | PRN

## 2019-03-03 MED ORDER — PROPOFOL 10 MG/ML IV BOLUS
INTRAVENOUS | Status: AC
  Filled 2019-03-03: qty 20

## 2019-03-03 MED ORDER — SODIUM CHLORIDE 0.9% FLUSH: 3.0000 mL | INTRAVENOUS | Status: DC | PRN

## 2019-03-03 MED ORDER — SODIUM CHLORIDE 0.9 % IV SOLN
INTRAVENOUS | Status: DC | PRN
  Administered 2019-03-03: 16:00:00

## 2019-03-03 MED ORDER — THROMBIN 5000 UNITS EX SOLR
CUTANEOUS | Status: AC
  Filled 2019-03-03: qty 10000

## 2019-03-03 MED ORDER — PHENYLEPHRINE 40 MCG/ML (10ML) SYRINGE FOR IV PUSH (FOR BLOOD PRESSURE SUPPORT)
PREFILLED_SYRINGE | INTRAVENOUS | Status: DC | PRN
  Administered 2019-03-03 (×2): 80 ug via INTRAVENOUS

## 2019-03-03 MED ORDER — BACITRACIN ZINC 500 UNIT/GM EX OINT
TOPICAL_OINTMENT | CUTANEOUS | Status: DC | PRN
  Administered 2019-03-03: 1 via TOPICAL

## 2019-03-03 MED ORDER — ALUM & MAG HYDROXIDE-SIMETH 200-200-20 MG/5ML PO SUSP: 30.0000 mL | Freq: Four times a day (QID) | ORAL | Status: DC | PRN

## 2019-03-03 MED ORDER — LIDOCAINE 2% (20 MG/ML) 5 ML SYRINGE
INTRAMUSCULAR | Status: AC
  Filled 2019-03-03: qty 5

## 2019-03-03 MED ORDER — 0.9 % SODIUM CHLORIDE (POUR BTL) OPTIME
TOPICAL | Status: DC | PRN
  Administered 2019-03-03: 1000 mL

## 2019-03-03 MED ORDER — THROMBIN 5000 UNITS EX SOLR
OROMUCOSAL | Status: DC | PRN
  Administered 2019-03-03: 16:00:00 via TOPICAL

## 2019-03-03 MED ORDER — ONDANSETRON HCL 4 MG/2ML IJ SOLN
INTRAMUSCULAR | Status: DC | PRN
  Administered 2019-03-03: 4 mg via INTRAVENOUS

## 2019-03-03 MED ORDER — GABAPENTIN 600 MG PO TABS
600.0000 mg | ORAL_TABLET | Freq: Three times a day (TID) | ORAL | Status: DC
  Administered 2019-03-03: 600 mg via ORAL
  Filled 2019-03-03: qty 1

## 2019-03-03 MED ORDER — ROCURONIUM BROMIDE 50 MG/5ML IV SOSY
PREFILLED_SYRINGE | INTRAVENOUS | Status: AC
  Filled 2019-03-03: qty 5

## 2019-03-03 MED ORDER — PHENOL 1.4 % MT LIQD: 1.0000 | OROMUCOSAL | Status: DC | PRN

## 2019-03-03 MED ORDER — ARTIFICIAL TEARS OPHTHALMIC OINT
TOPICAL_OINTMENT | OPHTHALMIC | Status: DC | PRN
  Administered 2019-03-03: 1 via OPHTHALMIC

## 2019-03-03 MED ORDER — ROCURONIUM BROMIDE 10 MG/ML (PF) SYRINGE
PREFILLED_SYRINGE | INTRAVENOUS | Status: DC | PRN
  Administered 2019-03-03: 10 mg via INTRAVENOUS
  Administered 2019-03-03: 90 mg via INTRAVENOUS

## 2019-03-03 MED ORDER — LACTATED RINGERS IV SOLN
INTRAVENOUS | Status: DC | PRN
  Administered 2019-03-03 (×2): via INTRAVENOUS

## 2019-03-03 MED ORDER — PANTOPRAZOLE SODIUM 40 MG IV SOLR: 40.0000 mg | Freq: Every day | INTRAVENOUS | Status: DC

## 2019-03-03 MED ORDER — CHLORHEXIDINE GLUCONATE CLOTH 2 % EX PADS: 6.0000 | MEDICATED_PAD | Freq: Once | CUTANEOUS | Status: DC

## 2019-03-03 MED ORDER — THROMBIN 5000 UNITS EX SOLR
CUTANEOUS | Status: DC | PRN
  Administered 2019-03-03 (×2): 5000 [IU] via TOPICAL

## 2019-03-03 MED ORDER — ESMOLOL HCL 100 MG/10ML IV SOLN
INTRAVENOUS | Status: AC
  Filled 2019-03-03: qty 10

## 2019-03-03 MED ORDER — LIDOCAINE-EPINEPHRINE 1 %-1:100000 IJ SOLN
INTRAMUSCULAR | Status: DC | PRN
  Administered 2019-03-03: 9 mL

## 2019-03-03 MED ORDER — ONDANSETRON HCL 4 MG/2ML IJ SOLN
INTRAMUSCULAR | Status: AC
  Filled 2019-03-03: qty 2

## 2019-03-03 MED ORDER — LIDOCAINE-EPINEPHRINE 1 %-1:100000 IJ SOLN
INTRAMUSCULAR | Status: AC
  Filled 2019-03-03: qty 1

## 2019-03-03 MED ORDER — ONDANSETRON HCL 4 MG/2ML IJ SOLN
4.0000 mg | Freq: Four times a day (QID) | INTRAMUSCULAR | Status: DC | PRN
  Administered 2019-03-03: 4 mg via INTRAVENOUS
  Filled 2019-03-03: qty 2

## 2019-03-03 MED ORDER — CYCLOBENZAPRINE HCL 10 MG PO TABS
ORAL_TABLET | ORAL | Status: AC
  Filled 2019-03-03: qty 1

## 2019-03-03 MED ORDER — ACETAMINOPHEN 650 MG RE SUPP: 650.0000 mg | RECTAL | Status: DC | PRN

## 2019-03-03 MED ORDER — ONDANSETRON HCL 4 MG PO TABS: 4.0000 mg | ORAL_TABLET | Freq: Four times a day (QID) | ORAL | Status: DC | PRN

## 2019-03-03 MED ORDER — BUPIVACAINE HCL (PF) 0.25 % IJ SOLN
INTRAMUSCULAR | Status: AC
  Filled 2019-03-03: qty 30

## 2019-03-03 MED ORDER — MIDAZOLAM HCL 2 MG/2ML IJ SOLN
INTRAMUSCULAR | Status: DC | PRN
  Administered 2019-03-03: 2 mg via INTRAVENOUS

## 2019-03-03 MED ORDER — BACITRACIN ZINC 500 UNIT/GM EX OINT
TOPICAL_OINTMENT | CUTANEOUS | Status: AC
  Filled 2019-03-03: qty 28.35

## 2019-03-03 MED ORDER — SUGAMMADEX SODIUM 200 MG/2ML IV SOLN
INTRAVENOUS | Status: DC | PRN
  Administered 2019-03-03: 400 mg via INTRAVENOUS

## 2019-03-03 MED ORDER — FENTANYL CITRATE (PF) 250 MCG/5ML IJ SOLN
INTRAMUSCULAR | Status: DC | PRN
  Administered 2019-03-03: 150 ug via INTRAVENOUS
  Administered 2019-03-03 (×2): 50 ug via INTRAVENOUS

## 2019-03-03 MED ORDER — SODIUM CHLORIDE 0.9 % IV SOLN
INTRAVENOUS | Status: DC | PRN
  Administered 2019-03-03: 80 ug/min via INTRAVENOUS

## 2019-03-03 MED ORDER — HYDROMORPHONE HCL 1 MG/ML IJ SOLN: 0.2500 mg | INTRAMUSCULAR | Status: DC | PRN

## 2019-03-03 MED ORDER — PROPOFOL 10 MG/ML IV BOLUS
INTRAVENOUS | Status: DC | PRN
  Administered 2019-03-03: 130 mg via INTRAVENOUS
  Administered 2019-03-03: 50 mg via INTRAVENOUS

## 2019-03-03 MED ORDER — DEXAMETHASONE SODIUM PHOSPHATE 10 MG/ML IJ SOLN
INTRAMUSCULAR | Status: DC | PRN
  Administered 2019-03-03: 10 mg via INTRAVENOUS

## 2019-03-03 MED ORDER — SODIUM CHLORIDE 0.9% FLUSH: 3.0000 mL | Freq: Two times a day (BID) | INTRAVENOUS | Status: DC

## 2019-03-03 MED ORDER — THROMBIN 5000 UNITS EX SOLR
CUTANEOUS | Status: AC
  Filled 2019-03-03: qty 5000

## 2019-03-03 MED ORDER — HEMOSTATIC AGENTS (NO CHARGE) OPTIME
TOPICAL | Status: DC | PRN
  Administered 2019-03-03: 1 via TOPICAL

## 2019-03-03 MED ORDER — CEFAZOLIN SODIUM-DEXTROSE 2-4 GM/100ML-% IV SOLN
2.0000 g | Freq: Three times a day (TID) | INTRAVENOUS | Status: AC
  Administered 2019-03-03 – 2019-03-04 (×2): 2 g via INTRAVENOUS
  Filled 2019-03-03 (×2): qty 100

## 2019-03-03 MED ORDER — BUPIVACAINE HCL (PF) 0.25 % IJ SOLN
INTRAMUSCULAR | Status: DC | PRN
  Administered 2019-03-03: 7 mL

## 2019-03-03 MED ORDER — ONDANSETRON HCL 4 MG/2ML IJ SOLN: 4.0000 mg | Freq: Once | INTRAMUSCULAR | Status: DC | PRN

## 2019-03-03 MED ORDER — HYDROCODONE-ACETAMINOPHEN 7.5-325 MG PO TABS
1.0000 | ORAL_TABLET | Freq: Four times a day (QID) | ORAL | Status: DC | PRN
  Administered 2019-03-03: 1 via ORAL
  Filled 2019-03-03: qty 1

## 2019-03-03 MED ORDER — CYCLOBENZAPRINE HCL 10 MG PO TABS
10.0000 mg | ORAL_TABLET | Freq: Three times a day (TID) | ORAL | Status: DC | PRN
  Administered 2019-03-03: 10 mg via ORAL

## 2019-03-03 MED ORDER — ARTIFICIAL TEARS OPHTHALMIC OINT
TOPICAL_OINTMENT | OPHTHALMIC | Status: AC
  Filled 2019-03-03: qty 3.5

## 2019-03-03 MED ORDER — ROCURONIUM BROMIDE 50 MG/5ML IV SOSY
PREFILLED_SYRINGE | INTRAVENOUS | Status: AC
  Filled 2019-03-03: qty 10

## 2019-03-03 MED ORDER — DEXAMETHASONE SODIUM PHOSPHATE 10 MG/ML IJ SOLN
10.0000 mg | INTRAMUSCULAR | Status: DC
  Filled 2019-03-03: qty 1

## 2019-03-03 MED ORDER — LACTATED RINGERS IV SOLN
INTRAVENOUS | Status: DC
  Administered 2019-03-03: 13:00:00 via INTRAVENOUS

## 2019-03-03 MED ORDER — ESMOLOL HCL 100 MG/10ML IV SOLN
INTRAVENOUS | Status: DC | PRN
  Administered 2019-03-03: 20 mg via INTRAVENOUS

## 2019-03-03 MED ORDER — OXYCODONE HCL 5 MG PO TABS
10.0000 mg | ORAL_TABLET | ORAL | Status: DC | PRN
  Administered 2019-03-04 (×3): 10 mg via ORAL
  Filled 2019-03-03 (×3): qty 2

## 2019-03-03 MED ORDER — MEPERIDINE HCL 50 MG/ML IJ SOLN: 6.2500 mg | INTRAMUSCULAR | Status: DC | PRN

## 2019-03-03 MED ORDER — FAMOTIDINE 20 MG PO TABS
20.0000 mg | ORAL_TABLET | Freq: Two times a day (BID) | ORAL | Status: DC
  Administered 2019-03-03 – 2019-03-04 (×2): 20 mg via ORAL
  Filled 2019-03-03 (×2): qty 1

## 2019-03-03 SURGICAL SUPPLY — 64 items
ADH SKN CLS APL DERMABOND .7 (GAUZE/BANDAGES/DRESSINGS) ×1
APL SKNCLS STERI-STRIP NONHPOA (GAUZE/BANDAGES/DRESSINGS) ×1
BAG DECANTER FOR FLEXI CONT (MISCELLANEOUS) ×3 IMPLANT
BENZOIN TINCTURE PRP APPL 2/3 (GAUZE/BANDAGES/DRESSINGS) ×4 IMPLANT
BLADE SURG 11 STRL SS (BLADE) ×3 IMPLANT
BUR MATCHSTICK NEURO 3.0 LAGG (BURR) ×3 IMPLANT
CANISTER SUCT 3000ML PPV (MISCELLANEOUS) ×3 IMPLANT
CARTRIDGE OIL MAESTRO DRILL (MISCELLANEOUS) ×1 IMPLANT
CLOSURE WOUND 1/2 X4 (GAUZE/BANDAGES/DRESSINGS) ×1
DERMABOND ADVANCED (GAUZE/BANDAGES/DRESSINGS) ×2
DERMABOND ADVANCED .7 DNX12 (GAUZE/BANDAGES/DRESSINGS) ×1 IMPLANT
DIFFUSER DRILL AIR PNEUMATIC (MISCELLANEOUS) ×3 IMPLANT
DRAPE C-ARM 42X72 X-RAY (DRAPES) ×6 IMPLANT
DRAPE LAPAROTOMY 100X72 PEDS (DRAPES) ×3 IMPLANT
DRAPE MICROSCOPE LEICA (MISCELLANEOUS) ×2 IMPLANT
DRAPE SURG 17X23 STRL (DRAPES) ×3 IMPLANT
DRSG OPSITE POSTOP 4X6 (GAUZE/BANDAGES/DRESSINGS) ×2 IMPLANT
DURAPREP 26ML APPLICATOR (WOUND CARE) ×3 IMPLANT
ELECT BLADE 4.0 EZ CLEAN MEGAD (MISCELLANEOUS) ×3
ELECT REM PT RETURN 9FT ADLT (ELECTROSURGICAL) ×3
ELECTRODE BLDE 4.0 EZ CLN MEGD (MISCELLANEOUS) IMPLANT
ELECTRODE REM PT RTRN 9FT ADLT (ELECTROSURGICAL) ×1 IMPLANT
GAUZE 4X4 16PLY RFD (DISPOSABLE) IMPLANT
GAUZE SPONGE 4X4 12PLY STRL (GAUZE/BANDAGES/DRESSINGS) ×3 IMPLANT
GLOVE BIO SURGEON STRL SZ7 (GLOVE) ×2 IMPLANT
GLOVE BIO SURGEON STRL SZ8 (GLOVE) ×3 IMPLANT
GLOVE BIOGEL PI IND STRL 6.5 (GLOVE) IMPLANT
GLOVE BIOGEL PI IND STRL 7.0 (GLOVE) IMPLANT
GLOVE BIOGEL PI IND STRL 7.5 (GLOVE) IMPLANT
GLOVE BIOGEL PI INDICATOR 6.5 (GLOVE) ×4
GLOVE BIOGEL PI INDICATOR 7.0 (GLOVE) ×6
GLOVE BIOGEL PI INDICATOR 7.5 (GLOVE) ×4
GLOVE INDICATOR 8.5 STRL (GLOVE) ×3 IMPLANT
GLOVE SS N UNI LF 6.5 STRL (GLOVE) ×4 IMPLANT
GLOVE SS N UNI LF 7.0 STRL (GLOVE) ×4 IMPLANT
GOWN STRL REUS W/ TWL LRG LVL3 (GOWN DISPOSABLE) IMPLANT
GOWN STRL REUS W/ TWL XL LVL3 (GOWN DISPOSABLE) ×1 IMPLANT
GOWN STRL REUS W/TWL 2XL LVL3 (GOWN DISPOSABLE) IMPLANT
GOWN STRL REUS W/TWL LRG LVL3 (GOWN DISPOSABLE) ×6
GOWN STRL REUS W/TWL XL LVL3 (GOWN DISPOSABLE) ×6
HEMOSTAT POWDER KIT SURGIFOAM (HEMOSTASIS) ×2 IMPLANT
KIT BASIN OR (CUSTOM PROCEDURE TRAY) ×3 IMPLANT
KIT TURNOVER KIT B (KITS) ×3 IMPLANT
MARKER SKIN DUAL TIP RULER LAB (MISCELLANEOUS) ×3 IMPLANT
NDL HYPO 25X1 1.5 SAFETY (NEEDLE) ×1 IMPLANT
NDL SPNL 20GX3.5 QUINCKE YW (NEEDLE) ×1 IMPLANT
NEEDLE HYPO 25X1 1.5 SAFETY (NEEDLE) ×3 IMPLANT
NEEDLE SPNL 20GX3.5 QUINCKE YW (NEEDLE) ×3 IMPLANT
NS IRRIG 1000ML POUR BTL (IV SOLUTION) ×3 IMPLANT
OIL CARTRIDGE MAESTRO DRILL (MISCELLANEOUS) ×3
PACK LAMINECTOMY NEURO (CUSTOM PROCEDURE TRAY) ×3 IMPLANT
PAD ARMBOARD 7.5X6 YLW CONV (MISCELLANEOUS) ×9 IMPLANT
PIN MAYFIELD SKULL DISP (PIN) ×3 IMPLANT
RUBBERBAND STERILE (MISCELLANEOUS) ×4 IMPLANT
SPONGE LAP 4X18 RFD (DISPOSABLE) IMPLANT
SPONGE SURGIFOAM ABS GEL SZ50 (HEMOSTASIS) ×3 IMPLANT
STRIP CLOSURE SKIN 1/2X4 (GAUZE/BANDAGES/DRESSINGS) ×2 IMPLANT
SUT VIC AB 0 CT1 18XCR BRD8 (SUTURE) ×1 IMPLANT
SUT VIC AB 0 CT1 8-18 (SUTURE) ×3
SUT VIC AB 2-0 CT1 18 (SUTURE) ×3 IMPLANT
SUT VICRYL 4-0 PS2 18IN ABS (SUTURE) ×3 IMPLANT
TOWEL GREEN STERILE (TOWEL DISPOSABLE) ×3 IMPLANT
TOWEL GREEN STERILE FF (TOWEL DISPOSABLE) ×3 IMPLANT
WATER STERILE IRR 1000ML POUR (IV SOLUTION) ×3 IMPLANT

## 2019-03-03 NOTE — Op Note (Signed)
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  EBL: Minimal  History of present illness: 53 year old gentleman with neck and right arm and hand numbness tingling weakness workup revealed severe foraminal stenosis and spondylosis C7-T1 and T1-T2 the patient's failure conservative treatment imaging findings and progression of clinical syndrome I recommended posterior cervical laminectomy and foraminotomies at those levels. I extensively reviewed the risks and benefits of the operation with him as well as perioperative course expectations of outcome and alternatives to surgery and he understood and agreed to proceed 4.  Operative procedure: Patient brought into the or was induced under general anesthesia positioned prone in pins in his neck was slightly flexed backs of his neck was shaved prepped and draped in routine sterile fashion a midline incision was made and Bovie left car was used to 10 subcutaneous tissues and subperiosteal dissections care lamina of C6, C7, T1-T2. Interoperative x-ray confirmed the location at C5-6 disc space level so laminotomies were begun to an interspaces below this at C7-T1 and T1-T2. Under Mike's cup illumination under biting the medial gutter allowed decompression of both the C8 and T1 nerve root and a C7-T1 identified a very large free fragment disc causing severe compression of the right C8 nerve root this was incised and cleaned out with a nerve hook a micro-pituitaries. At the end of the discectomies and decompression no further stenosis on the thecal sac or the C8 nerve root and the T1 to foraminotomies completely  decompressed and T1 nerve root which had some spondylitic spurring compressing the dorsal aspect of her. Meticulous in space was maintained Gelfoam was opened up the duraand the wounds closed in layers with interrupted Vicryl skin was closed running 4 subcuticular Dermabond benzo and Steri-Strips and sterile dressing was applied and patient recovered in stable condition. At the end the case all needle counts sponge counts were correct.

## 2019-03-03 NOTE — Anesthesia Procedure Notes (Signed)
Procedure Name: Intubation Date/Time: 03/03/2019 3:03 PM Performed by: Izola Price., CRNA Pre-anesthesia Checklist: Patient identified, Emergency Drugs available, Suction available, Patient being monitored and Timeout performed Patient Re-evaluated:Patient Re-evaluated prior to induction Oxygen Delivery Method: Circle system utilized Preoxygenation: Pre-oxygenation with 100% oxygen Induction Type: IV induction Ventilation: Oral airway inserted - appropriate to patient size Laryngoscope Size: Glidescope and 4 Grade View: Grade I Tube type: Oral Tube size: 7.5 mm Number of attempts: 1 Airway Equipment and Method: Patient positioned with wedge pillow Placement Confirmation: ETT inserted through vocal cords under direct vision,  positive ETCO2,  CO2 detector and breath sounds checked- equal and bilateral Secured at: 23 cm Tube secured with: Tape

## 2019-03-03 NOTE — Transfer of Care (Signed)
Immediate Anesthesia Transfer of Care Note  Patient: Logan Holt  Procedure(s) Performed: Right Cervical Seven-Thoracic One, Thoracic One-Thoracic Two Foraminotomy (Right Spine Thoracic)  Patient Location: PACU  Anesthesia Type:General  Level of Consciousness: awake, alert  and oriented  Airway & Oxygen Therapy: Patient Spontanous Breathing and Patient connected to face mask oxygen  Post-op Assessment: Report given to RN and Post -op Vital signs reviewed and stable  Post vital signs: Reviewed and stable  Last Vitals:  Vitals Value Taken Time  BP    Temp    Pulse    Resp    SpO2      Last Pain:  Vitals:   03/03/19 1254  TempSrc:   PainSc: 7          Complications: No apparent anesthesia complications

## 2019-03-03 NOTE — H&P (Signed)
Logan Holt is an 53 y.o. male.   Chief Complaint: right and weakness HPI: 53 year old them is a progress worsening neck pain right arm pain numbness tingling weakness in his right hand. Workup revealed severe spondylosis with foraminal stenosis at C7-T1 and T1-T2 on the right due to patient's failure conservative treatment imaging findings and progressive clinical syndrome I recommended foraminotomies posterior cervical at C7-T1 and 60 T1-T2. I extensively went over the risks and benefits of the operation with him as well as perioperative course expectations of outcome and alternatives to surgery and he understood and agreed to proceed forward.  Past Medical History:  Diagnosis Date  . Blood transfusion without reported diagnosis   . Cervical stenosis of spine   . Depression   . GERD (gastroesophageal reflux disease)    prn Nexium  . History of hypertension    was taken off med. after losing weight  . History of kidney stones   . Hypertension   . OSA (obstructive sleep apnea)    was diagnosed when weight was 205; does not use CPAP  . Rotator cuff tear 03/2014   right  . Sarcoidosis     Past Surgical History:  Procedure Laterality Date  . BRONCHOSCOPY    . KNEE ARTHROSCOPY Left   . ORIF FEMUR FRACTURE Left   . SHOULDER ARTHROSCOPY WITH ROTATOR CUFF REPAIR AND SUBACROMIAL DECOMPRESSION Right 04/21/2014   Procedure: RIGHT SHOULDER ARTHROSCOPY SUBACROMIAL DECOMPRESSION/DISTAL CLAVICLE RESECTION; OPEN BICEPS TENODESIS AND ROTATOR CUFF REPAIR ;  Surgeon: Wyn Forster., MD;  Location:  SURGERY CENTER;  Service: Orthopedics;  Laterality: Right;  . WISDOM TOOTH EXTRACTION     10+ years ago    History reviewed. No pertinent family history. Social History:  reports that he has never smoked. He has never used smokeless tobacco. He reports that he does not drink alcohol or use drugs.  Allergies: No Known Allergies  Medications Prior to Admission  Medication Sig Dispense Refill   . amLODipine (NORVASC) 5 MG tablet Take 5 mg by mouth daily.    Marland Kitchen gabapentin (NEURONTIN) 600 MG tablet Take 600 mg by mouth 3 (three) times daily.    Marland Kitchen HYDROcodone-acetaminophen (NORCO) 7.5-325 MG tablet Take 1 tablet by mouth every 6 (six) hours as needed for moderate pain.    . cefdinir (OMNICEF) 300 MG capsule Take 1 capsule (300 mg total) by mouth 2 (two) times daily. (Patient not taking: Reported on 02/19/2019) 56 capsule 0  . famotidine (PEPCID) 20 MG tablet One twice daily after bfast and supper (Patient taking differently: Take 20 mg by mouth daily as needed for heartburn or indigestion. )    . Menthol, Topical Analgesic, (BENGAY EX) Apply 1 application topically daily as needed (pain).      No results found for this or any previous visit (from the past 48 hour(s)). No results found.  Review of Systems  Musculoskeletal: Positive for neck pain.  Neurological: Positive for tingling, sensory change and focal weakness.    Blood pressure (!) 134/99, pulse 79, temperature 97.7 F (36.5 C), temperature source Oral, resp. rate 20, height 5\' 5"  (1.651 m), weight 89.8 kg, SpO2 97 %. Physical Exam  Constitutional: He is oriented to person, place, and time. He appears well-developed.  HENT:  Head: Normocephalic.  Eyes: Pupils are equal, round, and reactive to light.  Neck: Normal range of motion.  Respiratory: Effort normal.  GI: Soft. Bowel sounds are normal.  Musculoskeletal: Normal range of motion.  Neurological: He is  alert and oriented to person, place, and time. He has normal strength. GCS eye subscore is 4. GCS verbal subscore is 5. GCS motor subscore is 6.  Right-hand weakness at 4-4+ out of 5 otherwise she is 5 out of 5 deltoid, bicep, tricep, wrist flexion, wrist extension,     Assessment/Plan 53 year old gentleman presents for right-sided C7-T1 and T1-T2 foraminotomy possible discectomies  [name], MD 03/03/2019, 2:12 PM

## 2019-03-04 ENCOUNTER — Encounter (HOSPITAL_COMMUNITY): Payer: Self-pay | Admitting: Neurosurgery

## 2019-03-04 DIAGNOSIS — M4723 Other spondylosis with radiculopathy, cervicothoracic region: Secondary | ICD-10-CM | POA: Diagnosis not present

## 2019-03-04 MED ORDER — OXYCODONE-ACETAMINOPHEN 5-325 MG PO TABS: 1.0000 | ORAL_TABLET | ORAL | 0 refills | Status: AC | PRN

## 2019-03-04 MED ORDER — CYCLOBENZAPRINE HCL 10 MG PO TABS: 10.0000 mg | ORAL_TABLET | Freq: Three times a day (TID) | ORAL | 0 refills | Status: DC | PRN

## 2019-03-04 NOTE — Progress Notes (Signed)
Patient alert and oriented, mae's well, voiding adequate amount of urine, swallowing without difficulty,  c/o soreness at time of discharge. Patient discharged home with family. Script and discharged instructions given to patient. Patient and family stated understanding of instructions given. Patient has an appointment with Dr. Wynetta Emery

## 2019-03-04 NOTE — Discharge Summary (Signed)
Physician Discharge Summary  Patient ID: Logan Holt MRN: [id number] DOB/AGE: August 24, 1966 53 y.o.  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 The patient tolerated the procedure well and was taken to the recovery room and then to the floor in stable condition. The hospital course was routine. There were no complications. The wound remained clean dry and intact. Pt had appropriate neck soreness. No complaints of arm pain or new N/T/W. The patient remained afebrile with stable vital signs, and tolerated a regular diet. The patient continued to increase activities, and pain was well controlled with oral pain medications.   Consults: None  Significant Diagnostic Studies:  Results for orders placed or performed during the hospital encounter of 02/25/19  Surgical pcr screen  Result Value Ref Range   MRSA, PCR NEGATIVE NEGATIVE   Staphylococcus aureus NEGATIVE NEGATIVE  CBC  Result Value Ref Range   WBC 4.7 4.0 - 10.5 K/uL   RBC 5.59 4.22 - 5.81 MIL/uL   Hemoglobin 15.5 13.0 - 17.0 g/dL   HCT 76.5 46.5 - 03.5 %   MCV 81.8 80.0 - 100.0 fL   MCH 27.7 26.0 - 34.0 pg   MCHC 33.9 30.0 - 36.0 g/dL   RDW 46.5 68.1 - 27.5 %   Platelets 286 150 - 400 K/uL   nRBC 0.0 0.0 - 0.2 %  Basic metabolic panel  Result Value Ref Range   Sodium 137 135 - 145 mmol/L   Potassium 3.9 3.5 - 5.1 mmol/L   Chloride 104 98 - 111 mmol/L   CO2 24 22 - 32 mmol/L   Glucose, Bld 87 70 - 99 mg/dL   BUN 10 6 - 20 mg/dL   Creatinine, Ser 1.70 0.61 - 1.24 mg/dL   Calcium 9.8 8.9 - 01.7 mg/dL   GFR calc non Af Amer >60 >60 mL/min   GFR calc Af Amer >60 >60 mL/min   Anion gap 9 5 - 15     Dg Cervical Spine 2-3 Views  Result Date: 03/03/2019 CLINICAL DATA:  Localization imaging for C7 through T2 foraminotomy. EXAM: DG C-ARM 61-120 MIN; CERVICAL SPINE - 2-3 VIEW COMPARISON:  Cervical MRI, 02/14/2019 FINDINGS: Two submitted portable lateral views of the cervical spine do not visualize the C6 or below portions of the spine. IMPRESSION: Portable cervical spine imaging for surgical localization. Electronically Signed   By: Amie Portland M.D.   On: 03/03/2019 19:25   Dg C-arm 1-60 Min  Result Date: 03/03/2019 CLINICAL DATA:  Localization imaging for C7 through T2 foraminotomy. EXAM: DG C-ARM 61-120 MIN; CERVICAL SPINE - 2-3 VIEW COMPARISON:  Cervical MRI, 02/14/2019 FINDINGS: Two submitted portable lateral views of the cervical spine do not visualize the C6 or below portions of the spine. IMPRESSION: Portable cervical spine imaging for surgical localization. Electronically Signed   By: Amie Portland M.D.   On: 03/03/2019 19:25    Antibiotics:  Anti-infectives (From admission, onward)   Start     Dose/Rate Route Frequency Ordered Stop   03/04/19 0600  ceFAZolin (ANCEF) IVPB 2g/100 mL premix     2 g 200 mL/hr over 30 Minutes Intravenous On call to O.R. 03/03/19 1235 03/04/19 0713   03/03/19 2200  ceFAZolin (ANCEF) IVPB 2g/100  mL premix     2 g 200 mL/hr over 30 Minutes Intravenous Every 8 hours 03/03/19 1836 03/04/19 0602   03/03/19 1609  bacitracin 50,000 Units in sodium chloride 0.9 % 500 mL irrigation  Status:  Discontinued       As needed 03/03/19 1610 03/03/19 1700      Discharge Exam: Blood pressure 113/81, pulse 89, temperature 98 F (36.7 C), temperature source Oral, resp. rate 20, height 5\' 5"  (1.651 m), weight 89.8 kg, SpO2 95 %. Neurologic: Grossly normal Ambulating and voiding well, incision cdi  Discharge Medications:   Allergies as of 03/04/2019   No Known Allergies     Medication List    TAKE these medications   amLODipine 5 MG tablet Commonly known  as:  NORVASC Take 5 mg by mouth daily.   BENGAY EX Apply 1 application topically daily as needed (pain).   cefdinir 300 MG capsule Commonly known as:  OMNICEF Take 1 capsule (300 mg total) by mouth 2 (two) times daily.   cyclobenzaprine 10 MG tablet Commonly known as:  FLEXERIL Take 1 tablet (10 mg total) by mouth 3 (three) times daily as needed for muscle spasms.   famotidine 20 MG tablet Commonly known as:  Pepcid One twice daily after bfast and supper What changed:    how much to take  how to take this  when to take this  reasons to take this  additional instructions   gabapentin 600 MG tablet Commonly known as:  NEURONTIN Take 600 mg by mouth 3 (three) times daily.   HYDROcodone-acetaminophen 7.5-325 MG tablet Commonly known as:  NORCO Take 1 tablet by mouth every 6 (six) hours as needed for moderate pain.   oxyCODONE-acetaminophen 5-325 MG tablet Commonly known as:  Percocet Take 1 tablet by mouth every 4 (four) hours as needed for severe pain.       Disposition: home   Final Dx: posterior cervical laminectomy and foraminotomies at C7-T1 and T1-T2  Discharge Instructions     Remove dressing in 72 hours   Complete by:  As directed    Call MD for:  difficulty breathing, headache or visual disturbances   Complete by:  As directed    Call MD for:  hives   Complete by:  As directed    Call MD for:  persistant dizziness or light-headedness   Complete by:  As directed    Call MD for:  persistant nausea and vomiting   Complete by:  As directed    Call MD for:  redness, tenderness, or signs of infection (pain, swelling, redness, odor or green/yellow discharge around incision site)   Complete by:  As directed    Call MD for:  severe uncontrolled pain   Complete by:  As directed    Call MD for:  temperature >100.4   Complete by:  As directed    Diet - low sodium heart healthy   Complete by:  As directed    Driving Restrictions   Complete by:  As directed     No driving for 2 weeks, no riding in the car for 1 week   Increase activity slowly   Complete by:  As directed    Lifting restrictions   Complete by:  As directed    No lifting more than 8 lbs         Signed: Tiana Loft Chandler Stofer 03/04/2019, 8:06 AM

## 2019-03-05 NOTE — Anesthesia Postprocedure Evaluation (Signed)
Anesthesia Post Note  Patient: Logan Holt  Procedure(s) Performed: Right Cervical Seven-Thoracic One, Thoracic One-Thoracic Two Foraminotomy (Right Spine Thoracic)     Patient location during evaluation: PACU Anesthesia Type: General Level of consciousness: awake and alert Pain management: pain level controlled Vital Signs Assessment: post-procedure vital signs reviewed and stable Respiratory status: spontaneous breathing, nonlabored ventilation, respiratory function stable and patient connected to nasal cannula oxygen Cardiovascular status: blood pressure returned to baseline and stable Postop Assessment: no apparent nausea or vomiting Anesthetic complications: no    Last Vitals:  Vitals:   03/04/19 0343 03/04/19 0736  BP: 128/89 113/81  Pulse: 94 89  Resp: 20 20  Temp: 36.7 C 36.7 C  SpO2: 95% 95%    Last Pain:  Vitals:   03/04/19 1013  TempSrc:   PainSc: 4                  Shelton Silvas

## 2020-03-14 IMAGING — RF CERVICAL SPINE - 2-3 VIEW
1 series · 2 of 2 positions shown · non-contrast
Comparison: Cervical MRI, 02/14/2019

CLINICAL DATA: Localization imaging for C7 through T2 foraminotomy.

EXAM:
DG C-ARM 61-120 MIN; CERVICAL SPINE - 2-3 VIEW

[Series 1: run · 2 of 2 slices shown]
[im 1/2]
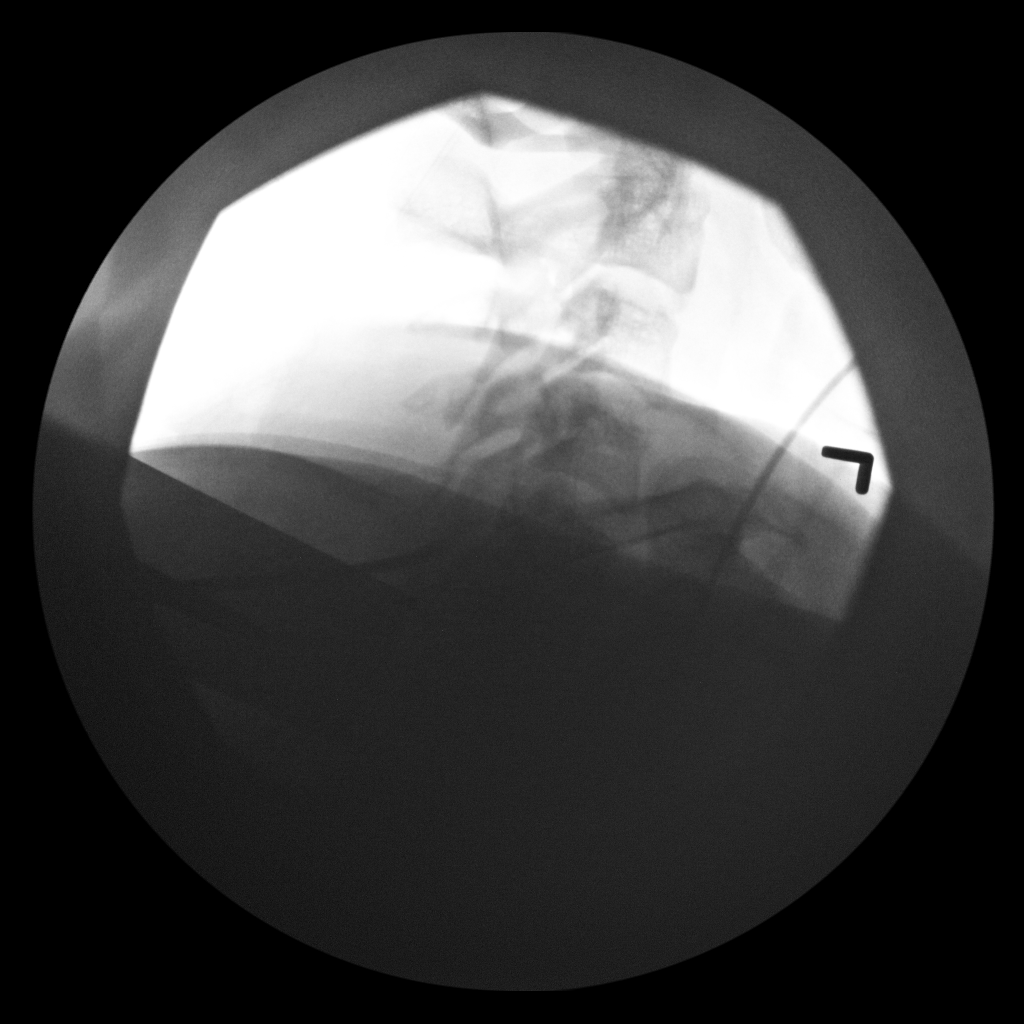
[im 2/2]
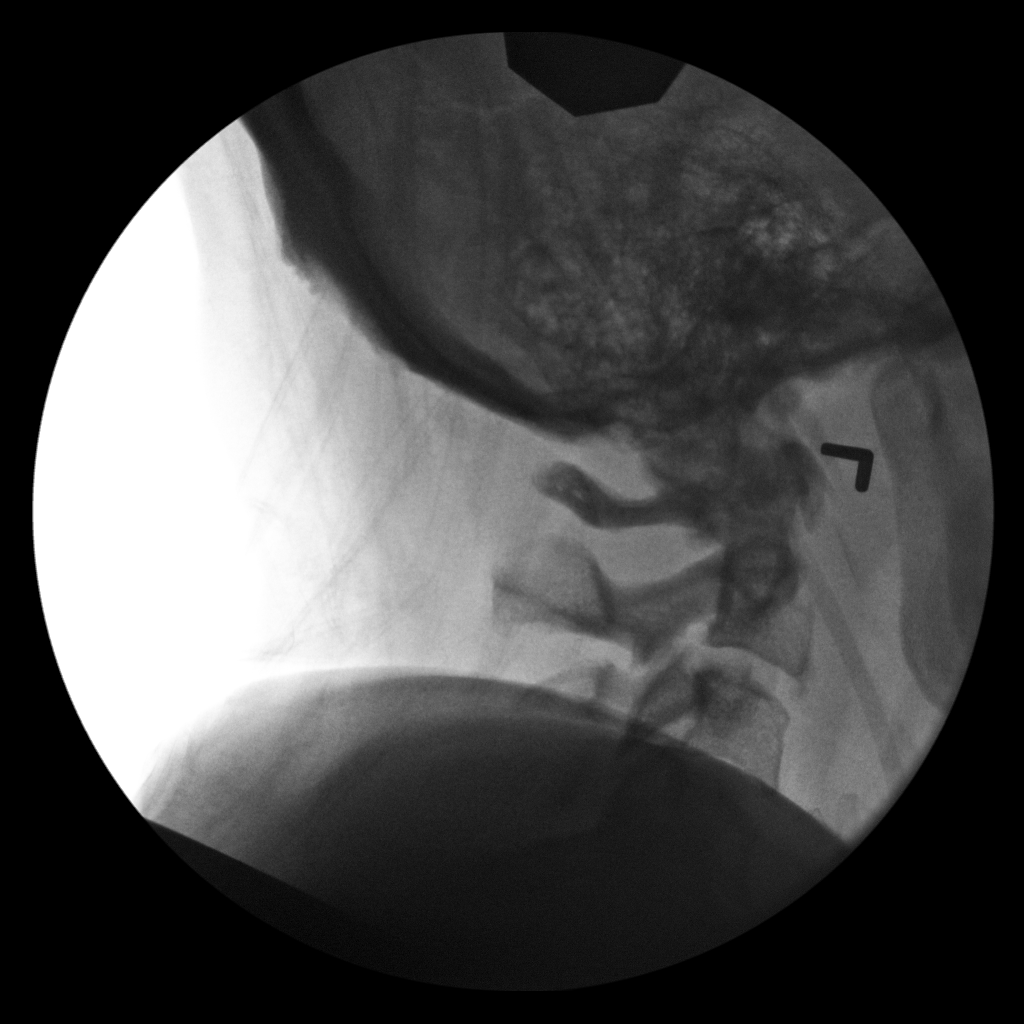

[2 of 2 positions shown; findings below may reference images not displayed]

FINDINGS: Two submitted portable lateral views of the cervical spine do not
visualize the C6 or below portions of the spine.
IMPRESSION: Portable cervical spine imaging for surgical localization.

## 2024-01-04 ENCOUNTER — Other Ambulatory Visit (HOSPITAL_COMMUNITY)
Admission: RE | Admit: 2024-01-04 | Discharge: 2024-01-04 | Disposition: A | Discharge status: Home / Self Care | Payer: Managed Care, Other (non HMO) | Source: Ambulatory Visit | Attending: Vascular Surgery | Admitting: Vascular Surgery

## 2024-01-04 DIAGNOSIS — R590 Localized enlarged lymph nodes: Secondary | ICD-10-CM | POA: Diagnosis present

## 2024-01-16 LAB — SURGICAL PATHOLOGY

## 2024-07-25 ENCOUNTER — Encounter (HOSPITAL_BASED_OUTPATIENT_CLINIC_OR_DEPARTMENT_OTHER): Payer: Self-pay | Admitting: Emergency Medicine

## 2024-07-25 ENCOUNTER — Other Ambulatory Visit: Payer: Self-pay

## 2024-07-25 ENCOUNTER — Emergency Department (HOSPITAL_BASED_OUTPATIENT_CLINIC_OR_DEPARTMENT_OTHER)
Admission: EM | Admit: 2024-07-25 | Discharge: 2024-07-25 | Disposition: A | Discharge status: Home / Self Care | Attending: Emergency Medicine | Admitting: Emergency Medicine

## 2024-07-25 ENCOUNTER — Emergency Department (HOSPITAL_BASED_OUTPATIENT_CLINIC_OR_DEPARTMENT_OTHER)

## 2024-07-25 DIAGNOSIS — I10 Essential (primary) hypertension: Secondary | ICD-10-CM | POA: Diagnosis not present

## 2024-07-25 DIAGNOSIS — R079 Chest pain, unspecified: Secondary | ICD-10-CM | POA: Diagnosis present

## 2024-07-25 DIAGNOSIS — Z79899 Other long term (current) drug therapy: Secondary | ICD-10-CM | POA: Diagnosis not present

## 2024-07-25 LAB — BASIC METABOLIC PANEL WITH GFR
Anion gap: 13 (ref 5–15)
BUN: 12 mg/dL (ref 6–20)
CO2: 24 mmol/L (ref 22–32)
Calcium: 9.9 mg/dL (ref 8.9–10.3)
Chloride: 101 mmol/L (ref 98–111)
Creatinine, Ser: 0.92 mg/dL (ref 0.61–1.24)
GFR, Estimated: >60 mL/min (ref >60)
Glucose, Bld: 87 mg/dL (ref 70–99)
Potassium: 3.7 mmol/L (ref 3.5–5.1)
Sodium: 138 mmol/L (ref 135–145)

## 2024-07-25 LAB — CBC
HCT: 45.9 % (ref 39.0–52.0)
Hemoglobin: 15.6 g/dL (ref 13.0–17.0)
MCH: 27.5 pg (ref 26.0–34.0)
MCHC: 34 g/dL (ref 30.0–36.0)
MCV: 80.8 fL (ref 80.0–100.0)
Platelets: 256 K/uL (ref 150–400)
RBC: 5.68 MIL/uL (ref 4.22–5.81)
RDW: 13.2 % (ref 11.5–15.5)
WBC: 6.3 K/uL (ref 4.0–10.5)
nRBC: 0 % (ref 0.0–0.2)

## 2024-07-25 LAB — TROPONIN T, HIGH SENSITIVITY
Troponin T High Sensitivity: <15 ng/L (ref <19)
Troponin T High Sensitivity: <15 ng/L (ref <19)

## 2024-07-25 NOTE — ED Triage Notes (Signed)
 Pt c/o chest pain and L side pain, since this AM.  Reports I think a got a little more agitated at work.  Hx of same.

## 2024-07-25 NOTE — Discharge Instructions (Signed)
 Today you were seen for chest pain.  Please follow-up with cardiology and your PCP for further evaluation and workup.  Please return to the ED if you have sudden shortness of breath, worsening pain, or uncontrollable vomiting.  Thank you for letting us  treat you today. After reviewing your labs and imaging, I feel you are safe to go home. Please follow up with your PCP in the next several days and provide them with your records from this visit. Return to the Emergency Room if pain becomes severe or symptoms worsen.

## 2024-07-25 NOTE — ED Provider Notes (Signed)
 [facility] Provider Note   CSN: [id number] Arrival date & time: 07/25/24  1235     Patient presents with: Chest Pain   Logan Holt is a 58 y.o. male past medical history Sig (OSA, hypertension, GERD, palpitations, and PVCs presents today for chest left-sided pain since this morning.  Patient reports the symptoms is chest tightness.  Patient denies nausea, vomiting, fever, chills, shortness of breath, numbness, diplopia, any other complaints at this time.  Patient does report he has had increased stressors at work and attributes this to the symptoms.    Chest Pain      Prior to Admission medications   Medication Sig Start Date End Date Taking? Authorizing Provider  amLODipine  (NORVASC ) 5 MG tablet Take 5 mg by mouth daily.    [provider]  cefdinir  (OMNICEF ) 300 MG capsule Take 1 capsule (300 mg total) by mouth 2 (two) times daily. Patient not taking: Reported on 02/19/2019 02/11/18   Darlean Ozell NOVAK, MD  cyclobenzaprine  (FLEXERIL ) 10 MG tablet Take 1 tablet (10 mg total) by mouth 3 (three) times daily as needed for muscle spasms. 03/04/19   Meyran, Suzen Lacks, NP  famotidine  (PEPCID ) 20 MG tablet One twice daily after bfast and supper Patient taking differently: Take 20 mg by mouth daily as needed for heartburn or indigestion.  02/04/18   Darlean Ozell NOVAK, MD  gabapentin  (NEURONTIN ) 600 MG tablet Take 600 mg by mouth 3 (three) times daily.    [provider]  HYDROcodone -acetaminophen  (NORCO) 7.5-325 MG tablet Take 1 tablet by mouth every 6 (six) hours as needed for moderate pain.    [provider]  Menthol , Topical Analgesic, (BENGAY EX) Apply 1 application topically daily as needed (pain).    [provider]    Allergies: Patient has no known allergies.    Review of Systems  Cardiovascular:  Positive for chest pain.    Updated Vital Signs BP (!) 122/93   Pulse 70   Temp 97.7 F (36.5 C)  (Oral)   Resp 11   Ht 5' 5 (1.651 m)   Wt 87.1 kg   SpO2 98%   BMI 31.95 kg/m   Physical Exam  (all labs ordered are listed, but only abnormal results are displayed) Labs Reviewed  BASIC METABOLIC PANEL WITH GFR  CBC  TROPONIN T, HIGH SENSITIVITY  TROPONIN T, HIGH SENSITIVITY    EKG: EKG Interpretation Date/Time:  Friday July 25 2024 12:43:27 EDT Ventricular Rate:  80 PR Interval:  174 QRS Duration:  89 QT Interval:  399 QTC Calculation: 461 R Axis:   -22  Text Interpretation: Sinus rhythm Borderline left axis deviation Consider anterior infarct no acute ischemic appearance,  no sig change fromprevious Confirmed by Armenta Canning [phone]) on 07/25/2024 12:49:50 PM  Radiology: [facility] Chest 2 View Result Date: 07/25/2024 CLINICAL DATA:  Chest pain. EXAM: CHEST - 2 VIEW COMPARISON:  02/08/2022. FINDINGS: Bilateral lung fields are clear. Bilateral costophrenic angles are clear. Normal cardio-mediastinal silhouette. No acute osseous abnormalities. The soft tissues are within normal limits. IMPRESSION: No active cardiopulmonary disease. Electronically Signed   By: Ree Molt M.D.   On: 07/25/2024 13:08     Procedures   Medications Ordered in the ED - No data to display                                  Medical Decision  Making Amount and/or Complexity of Data Reviewed Labs: ordered. Radiology: ordered.   This patient presents to the ED for concern of chest pain, this involves an extensive number of treatment options, and is a complaint that carries with it a high risk of complications and morbidity.  The differential diagnosis includes STEMI, NSTEMI, arrhythmia, electrolyte abnormality, anemia, anxiety, GERD   Co morbidities / Chronic conditions that complicate the patient evaluation  Palpitations, PVCs, hypertension, GERD   Additional history obtained:  Additional history obtained from EMR External records from outside source obtained and reviewed including Care  Everywhere   Lab Tests:  I Ordered, and personally interpreted labs.  The pertinent results include: BMP and CBC WNL, delta troponin less than 15   Imaging Studies ordered:  I ordered imaging studies including chest x-ray I independently visualized and interpreted imaging which showed no active cardiopulmonary disease I agree with the radiologist interpretation   Cardiac Monitoring: / EKG:  The patient was maintained on a cardiac monitor.  I personally viewed and interpreted the cardiac monitored which showed an underlying rhythm of: Sinus rhythm, borderline left axis deviation   Test / Admission - Considered:  Considered for admission or further workup however patient's vital signs, physical exam, labs, and imaging are reassuring.  Patient given ambulatory referral to cardiology for further evaluation workup.  Patient advised to follow-up with primary care for further evaluation and possible treatment for potential anxiety.  Patient given return precautions.  I feel patient safe for discharge at this time.     Final diagnoses:  Chest pain, unspecified type    ED Discharge Orders          Ordered    Ambulatory referral to Cardiology       Comments: If you have not heard from the Cardiology office within the next 72 hours please call [phone].   07/25/24 1615               Francis Ileana SAILOR, PA-C 07/25/24 1616    Armenta Canning, MD 07/31/24 [phone]

## 2024-07-30 ENCOUNTER — Ambulatory Visit: Attending: Cardiology | Admitting: Cardiology

## 2024-07-30 ENCOUNTER — Encounter: Payer: Self-pay | Admitting: Cardiology

## 2024-07-30 VITALS — BP 114/78 | HR 84 | Ht 65.0 in | Wt 200.0 lb

## 2024-07-30 DIAGNOSIS — R0789 Other chest pain: Secondary | ICD-10-CM

## 2024-07-30 DIAGNOSIS — G4733 Obstructive sleep apnea (adult) (pediatric): Secondary | ICD-10-CM

## 2024-07-30 DIAGNOSIS — R072 Precordial pain: Secondary | ICD-10-CM | POA: Insufficient documentation

## 2024-07-30 DIAGNOSIS — I10 Essential (primary) hypertension: Secondary | ICD-10-CM | POA: Insufficient documentation

## 2024-07-30 DIAGNOSIS — I493 Ventricular premature depolarization: Secondary | ICD-10-CM | POA: Diagnosis not present

## 2024-07-30 DIAGNOSIS — R0609 Other forms of dyspnea: Secondary | ICD-10-CM

## 2024-07-30 MED ORDER — METOPROLOL TARTRATE 100 MG PO TABS: ORAL_TABLET | ORAL | 0 refills | Status: AC

## 2024-07-30 MED ORDER — ASPIRIN 81 MG PO TBEC: 81.0000 mg | DELAYED_RELEASE_TABLET | Freq: Every day | ORAL | Status: AC

## 2024-07-30 NOTE — Addendum Note (Signed)
 Addended by: ARLOA PLANAS D on: 07/30/2024 03:05 PM   Modules accepted: Orders

## 2024-07-30 NOTE — Progress Notes (Signed)
 Cardiology Consultation:    Date:  07/30/2024   ID:  Logan Holt, DOB 20-Sep-1966, MRN [id number]  PCP:  Millicent Sharper, MD  Cardiologist:  Lamar Fitch, MD   Referring MD: Francis Ileana SAILOR, PA-C   No chief complaint on file.   History of Present Illness:    Logan Holt is a 58 y.o. male who is being seen today for the evaluation of chest pain at the request of Francis Ileana SAILOR, PA-C.  Past medical history significant for essential hypertension, obstructive sleep apnea not using CPAP mask, GERD, depression.  He was referred to us  because of chest pain.  He said for about a year anytime he get upset or stressed out and he does have a lot of the situations where he will develop heaviness in the chest that can last all day all night taking deep breath coughing does not make any difference he grades as 5 and scale up to 10.  Even though he can go and sleep with that.  There is no shortness of breath associated with this sensation but no palpitations no dizziness no passing out.  Does not smoke, does not have family history of premature coronary disease he was seen in the emergency room was evaluated troponins were negative EKG did not show any acute changes.  Past Medical History:  Diagnosis Date   Blood transfusion without reported diagnosis    Cervical stenosis of spine    Depression    GERD (gastroesophageal reflux disease)    prn Nexium   History of hypertension    was taken off med. after losing weight   History of kidney stones    Hypertension    OSA (obstructive sleep apnea)    was diagnosed when weight was 205; does not use CPAP   Rotator cuff tear 03/2014   right   Sarcoidosis     Past Surgical History:  Procedure Laterality Date   BRONCHOSCOPY     KNEE ARTHROSCOPY Left    ORIF FEMUR FRACTURE Left    POSTERIOR CERVICAL LAMINECTOMY Right 03/03/2019   Procedure: Right Cervical Seven-Thoracic One, Thoracic One-Thoracic Two Foraminotomy;  Surgeon: Onetha Kuba, MD;  Location:  [facility] OR;  Service: Neurosurgery;  Laterality: Right;   SHOULDER ARTHROSCOPY WITH ROTATOR CUFF REPAIR AND SUBACROMIAL DECOMPRESSION Right 04/21/2014   Procedure: RIGHT SHOULDER ARTHROSCOPY SUBACROMIAL DECOMPRESSION/DISTAL CLAVICLE RESECTION; OPEN BICEPS TENODESIS AND ROTATOR CUFF REPAIR ;  Surgeon: Lamar LULLA Leonor Mickey., MD;  Location: [facility];  Service: Orthopedics;  Laterality: Right;   WISDOM TOOTH EXTRACTION     10+ years ago    Current Medications: Current Meds  Medication Sig   amLODipine  (NORVASC ) 5 MG tablet Take 5 mg by mouth daily.   diclofenac (VOLTAREN) 75 MG EC tablet Take 75 mg by mouth 2 (two) times daily.   omeprazole (PRILOSEC) 20 MG capsule Take 20 mg by mouth daily.   rosuvastatin (CRESTOR) 20 MG tablet Take 20 mg by mouth at bedtime.   tadalafil (CIALIS) 5 MG tablet Take 5 mg by mouth at bedtime.     Allergies:   Patient has no known allergies.   Social History   Socioeconomic History   Marital status: Single    Spouse name: Not on file   Number of children: Not on file   Years of education: Not on file   Highest education level: Not on file  Occupational History   Not on file  Tobacco Use   Smoking status: Never  Smokeless tobacco: Never  Vaping Use   Vaping status: Former  Substance and Sexual Activity   Alcohol use: No    Alcohol/week: 0.0 standard drinks of alcohol   Drug use: No   Sexual activity: Not on file  Other Topics Concern   Not on file  Social History Narrative   Not on file   Social Drivers of Health   Financial Resource Strain: Not on file  Food Insecurity: Not on file  Transportation Needs: Not on file  Physical Activity: Not on file  Stress: Not on file  Social Connections: Not on file     Family History: The patient's family history is not on file. ROS:   Please see the history of present illness.    All 14 point review of systems negative except as described per history of present illness.  EKGs/Labs/Other  Studies Reviewed:    The following studies were reviewed today:   EKG:  EKG Interpretation Date/Time:  Wednesday July 30 2024 14:24:12 EDT Ventricular Rate:  88 PR Interval:  188 QRS Duration:  86 QT Interval:  384 QTC Calculation: 464 R Axis:   -19  Text Interpretation: Sinus rhythm with occasional Premature ventricular complexes Minimal voltage criteria for LVH, may be normal variant ( R in aVL ) Nonspecific T wave abnormality Prolonged QT When compared with ECG of 25-Jul-2024 12:43, PREVIOUS ECG IS PRESENT Confirmed by Bernie Charleston [phone]) on 07/30/2024 2:35:45 PM    Recent Labs: 07/25/2024: BUN 12; Creatinine, Ser 0.92; Hemoglobin 15.6; Platelets 256; Potassium 3.7; Sodium 138  Recent Lipid Panel No results found for: CHOL, TRIG, HDL, CHOLHDL, VLDL, LDLCALC, LDLDIRECT  Physical Exam:    VS:  BP 114/78   Pulse 84   Ht 5' 5 (1.651 m)   Wt 200 lb (90.7 kg)   SpO2 98%   BMI 33.28 kg/m     Wt Readings from Last 3 Encounters:  07/30/24 200 lb (90.7 kg)  07/25/24 192 lb (87.1 kg)  03/03/19 198 lb (89.8 kg)     GEN:  Well nourished, well developed in no acute distress HEENT: Normal NECK: No JVD; No carotid bruits LYMPHATICS: No lymphadenopathy CARDIAC: RRR, no murmurs, no rubs, no gallops RESPIRATORY:  Clear to auscultation without rales, wheezing or rhonchi  ABDOMEN: Soft, non-tender, non-distended MUSCULOSKELETAL:  No edema; No deformity  SKIN: Warm and dry NEUROLOGIC:  Alert and oriented x 3 PSYCHIATRIC:  Normal affect   ASSESSMENT:    1. Chest pressure   2. Precordial chest pain   3. OSA (obstructive sleep apnea)   4. PVC's (premature ventricular contractions)   5. Essential hypertension    PLAN:    In order of problems listed above:  Chest pain with some worrisome characteristic.  I will ask him to start taking 1 baby aspirin  every single day, we will schedule him to have coronary CT angio to see if he had an obstructive  disease. Dyspnea on exertion, I will schedule him to have echocardiogram to assess left ventricle ejection fraction. Dyslipidemia he is taking rosuvastatin 20 mg daily I do not have the results of his fasting lipid profile later will do the test. PVCs he does not feel any palpitations.  Continue monitoring. Obstructive sleep apnea, sadly he does not use CPAP mask even though he does have it at home.  I encouraged him to use it more   Medication Adjustments/Labs and Tests Ordered: Current medicines are reviewed at length with the patient today.  Concerns regarding medicines are  outlined above.  Orders Placed This Encounter  Procedures   EKG 12-Lead   No orders of the defined types were placed in this encounter.   Signed, Lamar DOROTHA Fitch, MD, [facility]. 07/30/2024 2:53 PM    [facility]

## 2024-07-30 NOTE — Patient Instructions (Addendum)
 Medication Instructions:   START: Aspirin  81mg  1 tablet daily  TAKE: Metoprolol  100mg  1 tablet 2 hours prior to CT   Lab Work: None Ordered If you have labs (blood work) drawn today and your tests are completely normal, you will receive your results only by: [employer] (if you have MyChart) OR A paper copy in the mail If you have any lab test that is abnormal or we need to change your treatment, we will call you to review the results.   Testing/Procedures:  Your physician has requested that you have an echocardiogram. Echocardiography is a painless test that uses sound waves to create images of your heart. It provides your doctor with information about the size and shape of your heart and how well your heart's chambers and valves are working. This procedure takes approximately one hour. There are no restrictions for this procedure. Please do NOT wear cologne, perfume, aftershave, or lotions (deodorant is allowed). Please arrive 15 minutes prior to your appointment time.  Please note: We ask at that you not bring children with you during ultrasound (echo/ vascular) testing. Due to room size and safety concerns, children are not allowed in the ultrasound rooms during exams. Our front office staff cannot provide observation of children in our lobby area while testing is being conducted. An adult accompanying a patient to their appointment will only be allowed in the ultrasound room at the discretion of the ultrasound technician under special circumstances. We apologize for any inconvenience.   Your cardiac CT will be scheduled at one of the below locations:   [facility] [address] [place], [place] [address]  Please follow these instructions carefully (unless otherwise directed):  Hold all erectile dysfunction medications at least 3 days (72 hrs) prior to test.  On the Night Before the Test: Be sure to Drink plenty of water. Do not consume any  caffeinated/decaffeinated beverages or chocolate 12 hours prior to your test. Do not take any antihistamines 12 hours prior to your test.   On the Day of the Test: Drink plenty of water until 1 hour prior to the test. Do not eat any food 4 hours prior to the test. You may take your regular medications prior to the test.  Take metoprolol  (Lopressor ) two hours prior to test.         After the Test: Drink plenty of water. After receiving IV contrast, you may experience a mild flushed feeling. This is normal. On occasion, you may experience a mild rash up to 24 hours after the test. This is not dangerous. If this occurs, you can take Benadryl 25 mg and increase your fluid intake. If you experience trouble breathing, this can be serious. If it is severe call 911 IMMEDIATELY. If it is mild, please call our office. If you take any of these medications: Glipizide/Metformin, Avandament, Glucavance, please do not take 48 hours after completing test unless otherwise instructed.  We will call to schedule your test 2-4 weeks out understanding that some insurance companies will need an authorization prior to the service being performed.   For non-scheduling related questions, please contact the cardiac imaging nurse navigator should you have any questions/concerns: Camie Shutter, Cardiac Imaging Nurse Navigator Chantal Requena, Cardiac Imaging Nurse Navigator [facility] Direct Office Dial: [phone]   For scheduling needs, including cancellations and rescheduling, please call [place], [phone].    Follow-Up: At [facility], you and your health needs are our priority.  As part  of our continuing mission to provide you with exceptional heart care, we have created designated Provider Care Teams.  These Care Teams include your primary Cardiologist (physician) and Advanced Practice Providers (APPs -  Physician Assistants and Nurse Practitioners) who all work together  to provide you with the care you need, when you need it.  We recommend signing up for the patient portal called MyChart.  Sign up information is provided on this After Visit Summary.  MyChart is used to connect with patients for Virtual Visits (Telemedicine).  Patients are able to view lab/test results, encounter notes, upcoming appointments, etc.  Non-urgent messages can be sent to your provider as well.   To learn more about what you can do with MyChart, go to [email or web address].    Your next appointment:   2 month(s)  The format for your next appointment:   In Person  Provider:   Lamar Fitch, MD    Other Instructions NA

## 2024-08-01 ENCOUNTER — Encounter (HOSPITAL_COMMUNITY): Payer: Self-pay

## 2024-08-04 ENCOUNTER — Telehealth (HOSPITAL_COMMUNITY): Payer: Self-pay | Admitting: *Deleted

## 2024-08-04 NOTE — Telephone Encounter (Signed)
 Attempted to call patient regarding upcoming cardiac CT appointment. Left message on voicemail with name and callback number  Larey Brick RN Navigator Cardiac Imaging [facility] [phone] Office [phone] Cell

## 2024-08-05 ENCOUNTER — Ambulatory Visit (HOSPITAL_BASED_OUTPATIENT_CLINIC_OR_DEPARTMENT_OTHER)
Admission: RE | Admit: 2024-08-05 | Discharge: 2024-08-05 | Disposition: A | Discharge status: Home / Self Care | Source: Ambulatory Visit | Attending: Cardiology | Admitting: Cardiology

## 2024-08-05 DIAGNOSIS — R072 Precordial pain: Secondary | ICD-10-CM | POA: Diagnosis present

## 2024-08-05 MED ORDER — NITROGLYCERIN 0.4 MG SL SUBL
0.8000 mg | SUBLINGUAL_TABLET | Freq: Once | SUBLINGUAL | Status: AC
  Administered 2024-08-05: 0.8 mg via SUBLINGUAL

## 2024-08-05 MED ORDER — ONDANSETRON HCL 4 MG/2ML IJ SOLN
4.0000 mg | Freq: Once | INTRAMUSCULAR | Status: AC
  Administered 2024-08-05: 4 mg via INTRAVENOUS

## 2024-08-05 MED ORDER — ONDANSETRON HCL 4 MG/2ML IJ SOLN
INTRAMUSCULAR | Status: AC
  Filled 2024-08-05: qty 2

## 2024-08-05 MED ORDER — IOHEXOL 350 MG/ML SOLN
100.0000 mL | Freq: Once | INTRAVENOUS | Status: AC | PRN
  Administered 2024-08-05: 95 mL via INTRAVENOUS

## 2024-08-13 ENCOUNTER — Ambulatory Visit: Payer: Self-pay | Admitting: Cardiology

## 2024-08-13 ENCOUNTER — Telehealth: Payer: Self-pay

## 2024-08-13 NOTE — Telephone Encounter (Signed)
 Left message on My Chart with CT Angio results per Dr. Karry note. Routed to PCP.

## 2024-08-15 ENCOUNTER — Telehealth: Payer: Self-pay

## 2024-08-15 NOTE — Telephone Encounter (Signed)
 LVM per DPR- per Dr. Karry note regarding CT Angio results. Encouraged to call with any questions. Routed to PCP.

## 2024-08-25 ENCOUNTER — Ambulatory Visit (HOSPITAL_BASED_OUTPATIENT_CLINIC_OR_DEPARTMENT_OTHER)
Admission: RE | Admit: 2024-08-25 | Discharge: 2024-08-25 | Disposition: A | Discharge status: Home / Self Care | Source: Ambulatory Visit | Attending: Cardiology | Admitting: Cardiology

## 2024-08-25 DIAGNOSIS — R0609 Other forms of dyspnea: Secondary | ICD-10-CM | POA: Insufficient documentation

## 2024-08-25 LAB — ECHOCARDIOGRAM COMPLETE
AR max vel: 2.33 cm2
AV Area VTI: 2.76 cm2
AV Area mean vel: 2.25 cm2
AV Mean grad: 3 mmHg
AV Peak grad: 6.4 mmHg
Ao pk vel: 1.27 m/s
Area-P 1/2: 4.1 cm2
Calc EF: 57.8 %
MV M vel: 1.56 m/s
MV Peak grad: 9.7 mmHg
S' Lateral: 2.9 cm
Single Plane A2C EF: 59.7 %
Single Plane A4C EF: 57.6 %

## 2024-08-29 ENCOUNTER — Telehealth: Payer: Self-pay

## 2024-08-29 NOTE — Telephone Encounter (Signed)
 Left message on My Chart with Echo results per Dr. Karry note. Routed to PCP.

## 2024-10-08 ENCOUNTER — Ambulatory Visit: Attending: Cardiology | Admitting: Cardiology

## 2024-10-24 ENCOUNTER — Telehealth: Payer: Self-pay | Admitting: *Deleted

## 2024-10-24 NOTE — Telephone Encounter (Signed)
   Name: Logan Holt  DOB: 09/12/1966  MRN: [id number]  Primary Cardiologist: None   Preoperative team, please contact this patient and set up a phone call appointment for further preoperative risk assessment. Please obtain consent and complete medication review. Thank you for your help.  I confirm that guidance regarding antiplatelet and oral anticoagulation therapy has been completed and, if necessary, noted below.  Ideally aspirin  should be continued without interruption, however if the bleeding risk is too great, aspirin  may be held for 5-7 days prior to surgery. Please resume aspirin  post operatively when it is felt to be safe from a bleeding standpoint.    I also confirmed the patient resides in the state of [place] . As per [facility] telemedicine laws, the patient must reside in the state in which the provider is licensed.    Barnie Hila, NP 10/24/2024, 4:18 PM [facility]

## 2024-10-24 NOTE — Telephone Encounter (Signed)
   Pre-operative Risk Assessment    Patient Name: Logan Holt  DOB: [birth year] MRN: [id number]      Request for Surgical Clearance    Procedure:  Left knee scope meniscectomy  Date of Surgery:  Clearance TBD                                 Surgeon:  Dr. Bonner Hair Surgeon's Group or Practice Name:  [facility] Phone number:  [phone] Fax number:  [phone]   Type of Clearance Requested:   - Medical  - Pharmacy:  Hold Aspirin  Directions   Type of Anesthesia:  General    Additional requests/questions:    SignedArloa Donovan Dines   10/24/2024, 3:50 PM

## 2024-10-27 NOTE — Telephone Encounter (Signed)
 Vm not set up, could not leave a message to call back to schedule a tele preop appt. I will update the requesting office the pt needs to call to schedule a tele preop appt.

## 2024-10-31 NOTE — Telephone Encounter (Signed)
 Left message to call back to schedule tele pre op appt.

## 2024-11-03 ENCOUNTER — Telehealth (HOSPITAL_BASED_OUTPATIENT_CLINIC_OR_DEPARTMENT_OTHER): Payer: Self-pay | Admitting: *Deleted

## 2024-11-03 NOTE — Telephone Encounter (Signed)
 S/w the pt and he has been scheduled tele preop appt 11/10/24. Med rec and consent are done.       Patient Consent for Virtual Visit        Logan Holt has provided verbal consent on 11/03/2024 for a virtual visit (video or telephone).   CONSENT FOR VIRTUAL VISIT FOR:  Logan Holt  By participating in this virtual visit I agree to the following:  I hereby voluntarily request, consent and authorize [facility] and its employed or contracted physicians, physician assistants, nurse practitioners or other licensed health care professionals (the Practitioner), to provide me with telemedicine health care services (the "Services) as deemed necessary by the treating Practitioner. I acknowledge and consent to receive the Services by the Practitioner via telemedicine. I understand that the telemedicine visit will involve communicating with the Practitioner through live audiovisual communication technology and the disclosure of certain medical information by electronic transmission. I acknowledge that I have been given the opportunity to request an in-person assessment or other available alternative prior to the telemedicine visit and am voluntarily participating in the telemedicine visit.  I understand that I have the right to withhold or withdraw my consent to the use of telemedicine in the course of my care at any time, without affecting my right to future care or treatment, and that the Practitioner or I may terminate the telemedicine visit at any time. I understand that I have the right to inspect all information obtained and/or recorded in the course of the telemedicine visit and may receive copies of available information for a reasonable fee.  I understand that some of the potential risks of receiving the Services via telemedicine include:  Delay or interruption in medical evaluation due to technological equipment failure or disruption; Information transmitted may not be sufficient (e.g.  poor resolution of images) to allow for appropriate medical decision making by the Practitioner; and/or  In rare instances, security protocols could fail, causing a breach of personal health information.  Furthermore, I acknowledge that it is my responsibility to provide information about my medical history, conditions and care that is complete and accurate to the best of my ability. I acknowledge that Practitioner's advice, recommendations, and/or decision may be based on factors not within their control, such as incomplete or inaccurate data provided by me or distortions of diagnostic images or specimens that may result from electronic transmissions. I understand that the practice of medicine is not an exact science and that Practitioner makes no warranties or guarantees regarding treatment outcomes. I acknowledge that a copy of this consent can be made available to me via my patient portal [facility] MyChart), or I can request a printed copy by calling the office of [facility].    I understand that my insurance will be billed for this visit.   I have read or had this consent read to me. I understand the contents of this consent, which adequately explains the benefits and risks of the Services being provided via telemedicine.  I have been provided ample opportunity to ask questions regarding this consent and the Services and have had my questions answered to my satisfaction. I give my informed consent for the services to be provided through the use of telemedicine in my medical care

## 2024-11-03 NOTE — Telephone Encounter (Signed)
 S/w the pt and he has been scheduled tele preop appt 11/10/24. Med rec and consent are done.

## 2024-11-10 ENCOUNTER — Ambulatory Visit: Attending: Cardiology | Admitting: Cardiology

## 2024-11-10 DIAGNOSIS — Z01810 Encounter for preprocedural cardiovascular examination: Secondary | ICD-10-CM | POA: Diagnosis not present

## 2024-11-10 DIAGNOSIS — Z01818 Encounter for other preprocedural examination: Secondary | ICD-10-CM

## 2024-11-10 NOTE — Progress Notes (Signed)
 Virtual Visit via Telephone Note   Because of Logan Holt co-morbid illnesses, he is at least at moderate risk for complications without adequate follow up.  This format is felt to be most appropriate for this patient at this time.  Due to technical limitations with video connection (technology), today's appointment will be conducted as an audio only telehealth visit, and Logan Holt verbally agreed to proceed in this manner.   All issues noted in this document were discussed and addressed.  No physical exam could be performed with this format.  Evaluation Performed:  Preoperative cardiovascular risk assessment _____________   Date:  11/10/2024   Patient ID:  Logan Holt, DOB [birth year], MRN [id number] Patient Location:  Home Provider location:   Office  Primary Care Provider:  Millicent Sharper, MD Primary Cardiologist:  Lamar Fitch, MD  Chief Complaint / Patient Profile   58 y.o. y/o male with a h/o mild nonobstructive CAD, hypertension, dyslipidemia who is pending left knee scope meniscectomy and presents today for telephonic preoperative cardiovascular risk assessment.  History of Present Illness    Logan Holt is a 58 y.o. male who presents via audio/video conferencing for a telehealth visit today.  Pt was last seen in cardiology clinic on 07/30/2024 by Dr. Fitch.  At that time Logan Holt was dealing with chest pain so a coronary CT was arranged revealing a calcium score of 55, the 64th percentile, an echocardiogram was also arranged revealing a normal EF.  The patient is now pending procedure as outlined above. Since his last visit, he has been diagnosed with anxiety, he is taking an anxiolytic and meeting with a therapist and he has not had any recurrent episodes of chest pain.  He stays very physically active both at work and around his house. He denies chest pain, palpitations, dyspnea, pnd, orthopnea, n, v, dizziness, syncope, edema, weight gain, or early satiety.      Past Medical History    Past Medical History:  Diagnosis Date   Blood transfusion without reported diagnosis    Cervical stenosis of spine    Depression    GERD (gastroesophageal reflux disease)    prn Nexium   History of hypertension    was taken off med. after losing weight   History of kidney stones    Hypertension    OSA (obstructive sleep apnea)    was diagnosed when weight was 205; does not use CPAP   Rotator cuff tear 03/2014   right   Sarcoidosis    Past Surgical History:  Procedure Laterality Date   BRONCHOSCOPY     KNEE ARTHROSCOPY Left    ORIF FEMUR FRACTURE Left    POSTERIOR CERVICAL LAMINECTOMY Right 03/03/2019   Procedure: Right Cervical Seven-Thoracic One, Thoracic One-Thoracic Two Foraminotomy;  Surgeon: Onetha Kuba, MD;  Location: [facility] OR;  Service: Neurosurgery;  Laterality: Right;   SHOULDER ARTHROSCOPY WITH ROTATOR CUFF REPAIR AND SUBACROMIAL DECOMPRESSION Right 04/21/2014   Procedure: RIGHT SHOULDER ARTHROSCOPY SUBACROMIAL DECOMPRESSION/DISTAL CLAVICLE RESECTION; OPEN BICEPS TENODESIS AND ROTATOR CUFF REPAIR ;  Surgeon: Lamar LULLA Leonor Mickey., MD;  Location: [facility];  Service: Orthopedics;  Laterality: Right;   WISDOM TOOTH EXTRACTION     10+ years ago    Allergies  No Known Allergies  Home Medications    Prior to Admission medications   Medication Sig Start Date End Date Taking? Authorizing Provider  amLODipine  (NORVASC ) 5 MG tablet Take 5 mg by mouth daily.    [provider]  aspirin  EC 81 MG tablet Take 1 tablet (81 mg total) by mouth daily. Swallow whole. 07/30/24   Krasowski, Robert J, MD  diclofenac (VOLTAREN) 75 MG EC tablet Take 75 mg by mouth 2 (two) times daily.    [provider]  metoprolol  tartrate (LOPRESSOR ) 100 MG tablet Take one tablet 2 hours before cardiac CT for heart greater than 55 07/30/24   Krasowski, Robert J, MD  omeprazole (PRILOSEC) 20 MG capsule Take 20 mg by mouth daily.    [provider]  rosuvastatin (CRESTOR) 20 MG tablet Take 20 mg by mouth at bedtime.    [provider]  tadalafil (CIALIS) 5 MG tablet Take 5 mg by mouth at bedtime.    [provider]    Physical Exam    Vital Signs:  BRONCO MCGRORY does not have vital signs available for review today.  Given telephonic nature of communication, physical exam is limited. AAOx3. NAD. Normal affect.  Speech and respirations are unlabored.  Accessory Clinical Findings    None  Assessment & Plan    1.  Preoperative Cardiovascular Risk Assessment:     Mr. Helmes perioperative risk of a major cardiac event is 0.4% according to the Revised Cardiac Risk Index (RCRI).  Therefore, he is at low risk for perioperative complications.   His functional capacity is excellent at 7.04 METs according to the Duke Activity Status Index (DASI). Recommendations: According to ACC/AHA guidelines, no further cardiovascular testing needed.  The patient may proceed to surgery at acceptable risk.   Antiplatelet and/or Anticoagulation Recommendations: Aspirin  can be held for 5-7 days prior to his surgery.  Please resume Aspirin  post operatively when it is felt to be safe from a bleeding standpoint.    The patient was advised that if he develops new symptoms prior to surgery to contact our office to arrange for a follow-up visit, and he verbalized understanding.    A copy of this note will be routed to requesting surgeon.  Time:   Today, I have spent 10 minutes with the patient with telehealth technology discussing medical history, symptoms, and management plan.     Delon JAYSON Hoover, NP  11/10/2024, 7:52 AM
